# Patient Record
Sex: Female | Born: 1995
Health system: Southern US, Community
[De-identification: ages and names within clinical notes are randomized; demographics above are authoritative.]

## PROBLEM LIST (undated history)

## (undated) DIAGNOSIS — J45909 Unspecified asthma, uncomplicated: Secondary | ICD-10-CM

## (undated) HISTORY — DX: Unspecified asthma, uncomplicated: J45.909

## (undated) HISTORY — PX: WISDOM TOOTH EXTRACTION: SHX21

---
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
## 2018-10-15 DIAGNOSIS — Z124 Encounter for screening for malignant neoplasm of cervix: Secondary | ICD-10-CM | POA: Diagnosis not present

## 2018-10-15 DIAGNOSIS — J45909 Unspecified asthma, uncomplicated: Secondary | ICD-10-CM | POA: Insufficient documentation

## 2018-10-15 DIAGNOSIS — Z6825 Body mass index (BMI) 25.0-25.9, adult: Secondary | ICD-10-CM | POA: Diagnosis not present

## 2018-10-15 DIAGNOSIS — Z01419 Encounter for gynecological examination (general) (routine) without abnormal findings: Secondary | ICD-10-CM | POA: Diagnosis not present

## 2019-01-07 DIAGNOSIS — M5441 Lumbago with sciatica, right side: Secondary | ICD-10-CM | POA: Diagnosis not present

## 2019-07-25 ENCOUNTER — Encounter: Payer: Self-pay | Admitting: Family Medicine

## 2019-07-25 ENCOUNTER — Ambulatory Visit (INDEPENDENT_AMBULATORY_CARE_PROVIDER_SITE_OTHER): Payer: BC Managed Care – PPO | Admitting: Family Medicine

## 2019-07-25 VITALS — BP 130/86 | HR 83 | Temp 98.3°F | Ht 65.0 in | Wt 137.0 lb

## 2019-07-25 DIAGNOSIS — G8929 Other chronic pain: Secondary | ICD-10-CM | POA: Diagnosis not present

## 2019-07-25 DIAGNOSIS — Z23 Encounter for immunization: Secondary | ICD-10-CM | POA: Diagnosis not present

## 2019-07-25 DIAGNOSIS — M545 Low back pain: Secondary | ICD-10-CM | POA: Diagnosis not present

## 2019-07-25 MED ORDER — NAPROXEN 500 MG PO TABS: 500.0000 mg | ORAL_TABLET | Freq: Two times a day (BID) | ORAL | 1 refills | Status: DC

## 2019-07-25 MED ORDER — CYCLOBENZAPRINE HCL 5 MG PO TABS: 5.0000 mg | ORAL_TABLET | Freq: Every evening | ORAL | 0 refills | Status: DC | PRN

## 2019-07-25 NOTE — Progress Notes (Signed)
Robyn [name] is a 23 y.o. female  Chief Complaint  Patient presents with  . Establish Care    est care/ lower back pain/ tightness and sometimes feels down right leg/ went to UC said she had sciatica gave stretches/ 5- 6 mo/ wants flu shot    HPI: Robyn KittenHannah Calvo is a 23 y.o. female here as a new patient to our office. She complains of 6-3454mo h/o low back pain Rt > Lt.  She went to UC in 12/2018, was diagnosed with sciatica, and given home stretching exercises to do at home. She took advil daily x 1-2 wks and now takes as needed.  She has a desk job and sits all day. She describes as a "tightness". No numbness or tingling. No change in bowel or bladder function.  Rarely pain radiates down Rt leg, mostly at night when she lays on Rt side.  No injury or trauma. Pain does not wake her up from sleep.   Pt would like flu vaccine.  Past Medical History:  Diagnosis Date  . Asthma     Past Surgical History:  Procedure Laterality Date  . WISDOM TOOTH EXTRACTION      Social History   Socioeconomic History  . Marital status: Married    Spouse name: Not on file  . Number of children: Not on file  . Years of education: Not on file  . Highest education level: Not on file  Occupational History  . Not on file  Social Needs  . Financial resource strain: Not on file  . Food insecurity    Worry: Not on file    Inability: Not on file  . Transportation needs    Medical: Not on file    Non-medical: Not on file  Tobacco Use  . Smoking status: Never Smoker  . Smokeless tobacco: Never Used  Substance and Sexual Activity  . Alcohol use: Yes    Comment: ccasionally  . Drug use: Never  . Sexual activity: Not on file  Lifestyle  . Physical activity    Days per week: Not on file    Minutes per session: Not on file  . Stress: Not on file  Relationships  . Social Musicianconnections    Talks on phone: Not on file    Gets together: Not on file    Attends religious service: Not on file   Active member of club or organization: Not on file    Attends meetings of clubs or organizations: Not on file    Relationship status: Not on file  . Intimate partner violence    Fear of current or ex partner: Not on file    Emotionally abused: Not on file    Physically abused: Not on file    Forced sexual activity: Not on file  Other Topics Concern  . Not on file  Social History Narrative  . Not on file    Family History  Problem Relation Age of Onset  . Asthma Mother   . Cancer Maternal Grandmother        endometrial  . Breast cancer Paternal Grandmother      There is no immunization history for the selected administration types on file for this patient.  Outpatient Encounter Medications as of 07/25/2019  Medication Sig  . levonorgestrel-ethinyl estradiol (ALESSE) 0.1-20 MG-MCG tablet Sronyx 0.1 mg-20 mcg tablet  TAKE 1 TABLET BY MOUTH EVERY DAY  . cyclobenzaprine (FLEXERIL) 5 MG tablet Take 1 tablet (5 mg total) by mouth at bedtime  as needed for muscle spasms.  . naproxen (NAPROSYN) 500 MG tablet Take 1 tablet (500 mg total) by mouth 2 (two) times daily with a meal.   No facility-administered encounter medications on file as of 07/25/2019.      ROS: Pertinent positives and negatives noted in HPI. Remainder of ROS non-contributory  No Known Allergies  BP 130/86   Pulse 83   Temp 98.3 F (36.8 C) (Oral)   Ht 5\' 5"  (1.651 m)   Wt 137 lb (62.1 kg)   SpO2 100%   BMI 22.80 kg/m   Physical Exam  Constitutional: She is oriented to person, place, and time. She appears well-developed and well-nourished. No distress.  Musculoskeletal: Normal range of motion.        General: No tenderness or edema.  Neurological: She is alert and oriented to person, place, and time. She displays normal reflexes. No cranial nerve deficit. She exhibits normal muscle tone. Coordination normal.  Skin: Skin is warm and dry.  Psychiatric: She has a normal mood and affect. Her behavior is normal.      A/P:  1. Need for influenza vaccination - Flu Vaccine QUAD 6+ mos PF IM (Fluarix Quad PF)  2. Chronic bilateral low back pain without sciatica - likely MSK in etiology - heat, stretching exercises - discussed ergonomic improvements for her workspace that could be beneficial Rx: - naproxen (NAPROSYN) 500 MG tablet; Take 1 tablet (500 mg total) by mouth 2 (two) times daily with a meal.  Dispense: 60 tablet; Refill: 1 - pt to take BID w food x 7-10 days - cyclobenzaprine (FLEXERIL) 5 MG tablet; Take 1 tablet (5 mg total) by mouth at bedtime as needed for muscle spasms.  Dispense: 15 tablet; Refill: 0 - pt to take PRN - if no/minimal improvement in 2-3 wks, could consider xray, PT referral Discussed plan and reviewed medications with patient, including risks, benefits, and potential side effects. Pt expressed understand. All questions answered.

## 2019-07-25 NOTE — Patient Instructions (Signed)
Use heating pad 2x/day for 15-20 min on then off Stretching exercises - see below Take naproxen 500mg  1 tab twice per day with food Take flexeril 5mg  1/2 to 1 tab at bedtime as needed   Low Back Sprain or Strain Rehab Ask your health care provider which exercises are safe for you. Do exercises exactly as told by your health care provider and adjust them as directed. It is normal to feel mild stretching, pulling, tightness, or discomfort as you do these exercises. Stop right away if you feel sudden pain or your pain gets worse. Do not begin these exercises until told by your health care provider. Stretching and range-of-motion exercises These exercises warm up your muscles and joints and improve the movement and flexibility of your back. These exercises also help to relieve pain, numbness, and tingling. Lumbar rotation  1. Lie on your back on a firm surface and bend your knees. 2. Straighten your arms out to your sides so each arm forms a 90-degree angle (right angle) with a side of your body. 3. Slowly move (rotate) both of your knees to one side of your body until you feel a stretch in your lower back (lumbar). Try not to let your shoulders lift off the floor. 4. Hold this position for __________ seconds. 5. Tense your abdominal muscles and slowly move your knees back to the starting position. 6. Repeat this exercise on the other side of your body. Repeat __________ times. Complete this exercise __________ times a day. Single knee to chest  1. Lie on your back on a firm surface with both legs straight. 2. Bend one of your knees. Use your hands to move your knee up toward your chest until you feel a gentle stretch in your lower back and buttock. ? Hold your leg in this position by holding on to the front of your knee. ? Keep your other leg as straight as possible. 3. Hold this position for __________ seconds. 4. Slowly return to the starting position. 5. Repeat with your other leg. Repeat  __________ times. Complete this exercise __________ times a day. Prone extension on elbows  1. Lie on your abdomen on a firm surface (prone position). 2. Prop yourself up on your elbows. 3. Use your arms to help lift your chest up until you feel a gentle stretch in your abdomen and your lower back. ? This will place some of your body weight on your elbows. If this is uncomfortable, try stacking pillows under your chest. ? Your hips should stay down, against the surface that you are lying on. Keep your hip and back muscles relaxed. 4. Hold this position for __________ seconds. 5. Slowly relax your upper body and return to the starting position. Repeat __________ times. Complete this exercise __________ times a day. Strengthening exercises These exercises build strength and endurance in your back. Endurance is the ability to use your muscles for a long time, even after they get tired. Pelvic tilt This exercise strengthens the muscles that lie deep in the abdomen. 1. Lie on your back on a firm surface. Bend your knees and keep your feet flat on the floor. 2. Tense your abdominal muscles. Tip your pelvis up toward the ceiling and flatten your lower back into the floor. ? To help with this exercise, you may place a small towel under your lower back and try to push your back into the towel. 3. Hold this position for __________ seconds. 4. Let your muscles relax completely before you  repeat this exercise. Repeat __________ times. Complete this exercise __________ times a day. Alternating arm and leg raises  1. Get on your hands and knees on a firm surface. If you are on a hard floor, you may want to use padding, such as an exercise mat, to cushion your knees. 2. Line up your arms and legs. Your hands should be directly below your shoulders, and your knees should be directly below your hips. 3. Lift your left leg behind you. At the same time, raise your right arm and straighten it in front of you.  ? Do not lift your leg higher than your hip. ? Do not lift your arm higher than your shoulder. ? Keep your abdominal and back muscles tight. ? Keep your hips facing the ground. ? Do not arch your back. ? Keep your balance carefully, and do not hold your breath. 4. Hold this position for __________ seconds. 5. Slowly return to the starting position. 6. Repeat with your right leg and your left arm. Repeat __________ times. Complete this exercise __________ times a day. Abdominal set with straight leg raise  1. Lie on your back on a firm surface. 2. Bend one of your knees and keep your other leg straight. 3. Tense your abdominal muscles and lift your straight leg up, 4-6 inches (10-15 cm) off the ground. 4. Keep your abdominal muscles tight and hold this position for __________ seconds. ? Do not hold your breath. ? Do not arch your back. Keep it flat against the ground. 5. Keep your abdominal muscles tense as you slowly lower your leg back to the starting position. 6. Repeat with your other leg. Repeat __________ times. Complete this exercise __________ times a day. Single leg lower with bent knees 1. Lie on your back on a firm surface. 2. Tense your abdominal muscles and lift your feet off the floor, one foot at a time, so your knees and hips are bent in 90-degree angles (right angles). ? Your knees should be over your hips and your lower legs should be parallel to the floor. 3. Keeping your abdominal muscles tense and your knee bent, slowly lower one of your legs so your toe touches the ground. 4. Lift your leg back up to return to the starting position. ? Do not hold your breath. ? Do not let your back arch. Keep your back flat against the ground. 5. Repeat with your other leg. Repeat __________ times. Complete this exercise __________ times a day. Posture and body mechanics Good posture and healthy body mechanics can help to relieve stress in your body's tissues and joints. Body  mechanics refers to the movements and positions of your body while you do your daily activities. Posture is part of body mechanics. Good posture means:  Your spine is in its natural S-curve position (neutral).  Your shoulders are pulled back slightly.  Your head is not tipped forward. Follow these guidelines to improve your posture and body mechanics in your everyday activities. Standing   When standing, keep your spine neutral and your feet about hip width apart. Keep a slight bend in your knees. Your ears, shoulders, and hips should line up.  When you do a task in which you stand in one place for a long time, place one foot up on a stable object that is 2-4 inches (5-10 cm) high, such as a footstool. This helps keep your spine neutral. Sitting   When sitting, keep your spine neutral and keep your feet flat  on the floor. Use a footrest, if necessary, and keep your thighs parallel to the floor. Avoid rounding your shoulders, and avoid tilting your head forward.  When working at a desk or a computer, keep your desk at a height where your hands are slightly lower than your elbows. Slide your chair under your desk so you are close enough to maintain good posture.  When working at a computer, place your monitor at a height where you are looking straight ahead and you do not have to tilt your head forward or downward to look at the screen. Resting  When lying down and resting, avoid positions that are most painful for you.  If you have pain with activities such as sitting, bending, stooping, or squatting, lie in a position in which your body does not bend very much. For example, avoid curling up on your side with your arms and knees near your chest (fetal position).  If you have pain with activities such as standing for a long time or reaching with your arms, lie with your spine in a neutral position and bend your knees slightly. Try the following positions: ? Lying on your side with a pillow  between your knees. ? Lying on your back with a pillow under your knees. Lifting   When lifting objects, keep your feet at least shoulder width apart and tighten your abdominal muscles.  Bend your knees and hips and keep your spine neutral. It is important to lift using the strength of your legs, not your back. Do not lock your knees straight out.  Always ask for help to lift heavy or awkward objects. This information is not intended to replace advice given to you by your health care provider. Make sure you discuss any questions you have with your health care provider. Document Released: 11/13/2005 Document Revised: 03/07/2019 Document Reviewed: 12/05/2018 Elsevier Patient Education  2020 [employer]

## 2019-09-30 ENCOUNTER — Other Ambulatory Visit: Payer: Self-pay | Admitting: Family Medicine

## 2019-09-30 DIAGNOSIS — G8929 Other chronic pain: Secondary | ICD-10-CM

## 2019-11-03 DIAGNOSIS — Z20828 Contact with and (suspected) exposure to other viral communicable diseases: Secondary | ICD-10-CM | POA: Diagnosis not present

## 2019-11-06 DIAGNOSIS — Z01419 Encounter for gynecological examination (general) (routine) without abnormal findings: Secondary | ICD-10-CM | POA: Diagnosis not present

## 2019-11-06 DIAGNOSIS — Z6822 Body mass index (BMI) 22.0-22.9, adult: Secondary | ICD-10-CM | POA: Diagnosis not present

## 2019-11-06 DIAGNOSIS — Z124 Encounter for screening for malignant neoplasm of cervix: Secondary | ICD-10-CM | POA: Diagnosis not present

## 2019-11-16 LAB — HM PAP SMEAR

## 2020-01-21 DIAGNOSIS — N926 Irregular menstruation, unspecified: Secondary | ICD-10-CM | POA: Diagnosis not present

## 2020-01-21 DIAGNOSIS — Z6822 Body mass index (BMI) 22.0-22.9, adult: Secondary | ICD-10-CM | POA: Diagnosis not present

## 2020-02-28 ENCOUNTER — Ambulatory Visit: Payer: BC Managed Care – PPO | Attending: Internal Medicine

## 2020-02-28 DIAGNOSIS — Z23 Encounter for immunization: Secondary | ICD-10-CM

## 2020-02-28 NOTE — Progress Notes (Signed)
   Covid-19 Vaccination Clinic  Name:  Khristy Kalan    MRN: [id number] DOB: [birth year]  02/28/2020  Ms. Rorrer was observed post Covid-19 immunization for 15 minutes without incident. She was provided with Vaccine Information Sheet and instruction to access the V-Safe system.   Ms. Berdan was instructed to call 911 with any severe reactions post vaccine: Marland Kitchen Difficulty breathing  . Swelling of face and throat  . A fast heartbeat  . A bad rash all over body  . Dizziness and weakness   Immunizations Administered    Name Date Dose VIS Date Route   Pfizer COVID-19 Vaccine 02/28/2020 12:56 PM 0.3 mL 11/07/2019 Intramuscular   Manufacturer: [employer], [employer]   Lot: [id number]   NDC: [address]-4

## 2020-03-03 DIAGNOSIS — N926 Irregular menstruation, unspecified: Secondary | ICD-10-CM | POA: Diagnosis not present

## 2020-03-03 DIAGNOSIS — Z6822 Body mass index (BMI) 22.0-22.9, adult: Secondary | ICD-10-CM | POA: Diagnosis not present

## 2020-03-23 ENCOUNTER — Ambulatory Visit: Payer: BC Managed Care – PPO | Attending: Internal Medicine

## 2020-03-23 DIAGNOSIS — Z23 Encounter for immunization: Secondary | ICD-10-CM

## 2020-03-23 NOTE — Progress Notes (Signed)
   Covid-19 Vaccination Clinic  Name:  Robyn [name]    MRN: [id number] DOB: 1996-05-08  03/23/2020  [name] was observed post Covid-19 immunization for 15 minutes without incident. She was provided with Vaccine Information Sheet and instruction to access the V-Safe system.   [name] was instructed to call 911 with any severe reactions post vaccine: Marland Kitchen Difficulty breathing  . Swelling of face and throat  . A fast heartbeat  . A bad rash all over body  . Dizziness and weakness   Immunizations Administered    Name Date Dose VIS Date Route   Pfizer COVID-19 Vaccine 03/23/2020 10:31 AM 0.3 mL 01/21/2019 Intramuscular   Manufacturer: [employer], [employer]   Lot: [id number]   NDC: [address]-8

## 2020-04-28 DIAGNOSIS — N970 Female infertility associated with anovulation: Secondary | ICD-10-CM | POA: Diagnosis not present

## 2020-06-01 DIAGNOSIS — Z3201 Encounter for pregnancy test, result positive: Secondary | ICD-10-CM | POA: Diagnosis not present

## 2020-06-01 DIAGNOSIS — N925 Other specified irregular menstruation: Secondary | ICD-10-CM | POA: Diagnosis not present

## 2020-06-01 DIAGNOSIS — Z6822 Body mass index (BMI) 22.0-22.9, adult: Secondary | ICD-10-CM | POA: Diagnosis not present

## 2020-06-01 DIAGNOSIS — Z3490 Encounter for supervision of normal pregnancy, unspecified, unspecified trimester: Secondary | ICD-10-CM | POA: Diagnosis not present

## 2020-06-01 DIAGNOSIS — Z113 Encounter for screening for infections with a predominantly sexual mode of transmission: Secondary | ICD-10-CM | POA: Diagnosis not present

## 2020-06-09 DIAGNOSIS — Z369 Encounter for antenatal screening, unspecified: Secondary | ICD-10-CM | POA: Diagnosis not present

## 2020-06-29 DIAGNOSIS — O3680X9 Pregnancy with inconclusive fetal viability, other fetus: Secondary | ICD-10-CM | POA: Diagnosis not present

## 2020-06-29 DIAGNOSIS — Z3480 Encounter for supervision of other normal pregnancy, unspecified trimester: Secondary | ICD-10-CM | POA: Diagnosis not present

## 2020-07-20 ENCOUNTER — Inpatient Hospital Stay (HOSPITAL_COMMUNITY)
Admission: AD | Admit: 2020-07-20 | Discharge: 2020-07-20 | Disposition: A | Discharge status: Home / Self Care | Payer: BC Managed Care – PPO | Attending: Obstetrics and Gynecology | Admitting: Obstetrics and Gynecology

## 2020-07-20 ENCOUNTER — Other Ambulatory Visit: Payer: Self-pay

## 2020-07-20 ENCOUNTER — Encounter (HOSPITAL_COMMUNITY): Payer: Self-pay | Admitting: Obstetrics and Gynecology

## 2020-07-20 DIAGNOSIS — Z791 Long term (current) use of non-steroidal anti-inflammatories (NSAID): Secondary | ICD-10-CM | POA: Diagnosis not present

## 2020-07-20 DIAGNOSIS — O99512 Diseases of the respiratory system complicating pregnancy, second trimester: Secondary | ICD-10-CM | POA: Diagnosis not present

## 2020-07-20 DIAGNOSIS — G44209 Tension-type headache, unspecified, not intractable: Secondary | ICD-10-CM | POA: Insufficient documentation

## 2020-07-20 DIAGNOSIS — J45909 Unspecified asthma, uncomplicated: Secondary | ICD-10-CM | POA: Insufficient documentation

## 2020-07-20 DIAGNOSIS — Z79899 Other long term (current) drug therapy: Secondary | ICD-10-CM | POA: Diagnosis not present

## 2020-07-20 DIAGNOSIS — O99352 Diseases of the nervous system complicating pregnancy, second trimester: Secondary | ICD-10-CM | POA: Diagnosis not present

## 2020-07-20 DIAGNOSIS — O30002 Twin pregnancy, unspecified number of placenta and unspecified number of amniotic sacs, second trimester: Secondary | ICD-10-CM | POA: Insufficient documentation

## 2020-07-20 DIAGNOSIS — Z3A14 14 weeks gestation of pregnancy: Secondary | ICD-10-CM | POA: Insufficient documentation

## 2020-07-20 LAB — CBC
HCT: 33.2 % — ABNORMAL LOW (ref 36.0–46.0)
Hemoglobin: 11.6 g/dL — ABNORMAL LOW (ref 12.0–15.0)
MCH: 31.3 pg (ref 26.0–34.0)
MCHC: 34.9 g/dL (ref 30.0–36.0)
MCV: 89.5 fL (ref 80.0–100.0)
Platelets: 305 10*3/uL (ref 150–400)
RBC: 3.71 MIL/uL — ABNORMAL LOW (ref 3.87–5.11)
RDW: 13.6 % (ref 11.5–15.5)
WBC: 9.9 10*3/uL (ref 4.0–10.5)
nRBC: 0 % (ref 0.0–0.2)

## 2020-07-20 LAB — URINALYSIS, ROUTINE W REFLEX MICROSCOPIC
Bilirubin Urine: NEGATIVE
Glucose, UA: NEGATIVE mg/dL
Hgb urine dipstick: NEGATIVE
Ketones, ur: 5 mg/dL — AB
Nitrite: NEGATIVE
Protein, ur: NEGATIVE mg/dL
Specific Gravity, Urine: 1.018 (ref 1.005–1.030)
pH: 7 (ref 5.0–8.0)

## 2020-07-20 MED ORDER — ACETAMINOPHEN 500 MG PO TABS: 1000.0000 mg | ORAL_TABLET | Freq: Four times a day (QID) | ORAL | Status: AC | PRN

## 2020-07-20 NOTE — Discharge Instructions (Signed)
General Headache Without Cause A headache is pain or discomfort that is felt around the head or neck area. There are many causes and types of headaches. In some cases, the cause may not be found. Follow these instructions at home: Watch your condition for any changes. Let your doctor know about them. Take these steps to help with your condition: Managing pain      Take over-the-counter and prescription medicines only as told by your doctor.  Lie down in a dark, quiet room when you have a headache.  If told, put ice on your head and neck area: ? Put ice in a plastic bag. ? Place a towel between your skin and the bag. ? Leave the ice on for 20 minutes, 2-3 times per day.  If told, put heat on the affected area. Use the heat source that your doctor recommends, such as a moist heat pack or a heating pad. ? Place a towel between your skin and the heat source. ? Leave the heat on for 20-30 minutes. ? Remove the heat if your skin turns bright red. This is very important if you are unable to feel pain, heat, or cold. You may have a greater risk of getting burned.  Keep lights dim if bright lights bother you or make your headaches worse. Eating and drinking  Eat meals on a regular schedule.  If you drink alcohol: ? Limit how much you use to:  0-1 drink a day for women.  0-2 drinks a day for men. ? Be aware of how much alcohol is in your drink. In the [place] one drink equals one 12 oz bottle of beer (355 mL), one 5 oz glass of wine (148 mL), or one 1 oz glass of hard liquor (44 mL).  Stop drinking caffeine, or reduce how much caffeine you drink. General instructions   Keep a journal to find out if certain things bring on headaches. For example, write down: ? What you eat and drink. ? How much sleep you get. ? Any change to your diet or medicines.  Get a massage or try other ways to relax.  Limit stress.  Sit up straight. Do not tighten (tense) your muscles.  Do not use any  products that contain nicotine or tobacco. This includes cigarettes, e-cigarettes, and chewing tobacco. If you need help quitting, ask your doctor.  Exercise regularly as told by your doctor.  Get enough sleep. This often means 7-9 hours of sleep each night.  Keep all follow-up visits as told by your doctor. This is important. Contact a doctor if:  Your symptoms are not helped by medicine.  You have a headache that feels different than the other headaches.  You feel sick to your stomach (nauseous) or you throw up (vomit).  You have a fever. Get help right away if:  Your headache gets very bad quickly.  Your headache gets worse after a lot of physical activity.  You keep throwing up.  You have a stiff neck.  You have trouble seeing.  You have trouble speaking.  You have pain in the eye or ear.  Your muscles are weak or you lose muscle control.  You lose your balance or have trouble walking.  You feel like you will pass out (faint) or you pass out.  You are mixed up (confused).  You have a seizure. Summary  A headache is pain or discomfort that is felt around the head or neck area.  There are many causes and   types of headaches. In some cases, the cause may not be found.  Keep a journal to help find out what causes your headaches. Watch your condition for any changes. Let your doctor know about them.  Contact a doctor if you have a headache that is different from usual, or if your headache is not helped by medicine.  Get help right away if your headache gets very bad, you throw up, you have trouble seeing, you lose your balance, or you have a seizure. This information is not intended to replace advice given to you by your health care provider. Make sure you discuss any questions you have with your health care provider. Document Revised: 06/03/2018 Document Reviewed: 06/03/2018 Elsevier Patient Education  2020 [employer].                   Safe Medications in  Pregnancy    Acne: Benzoyl Peroxide Salicylic Acid  Backache/Headache: Tylenol: 2 regular strength every 4 hours OR              2 Extra strength every 6 hours  Colds/Coughs/Allergies: Benadryl (alcohol free) 25 mg every 6 hours as needed Breath right strips Claritin Cepacol throat lozenges Chloraseptic throat spray Cold-Eeze- up to three times per day Cough drops, alcohol free Flonase (by prescription only) Guaifenesin Mucinex Robitussin DM (plain only, alcohol free) Saline nasal spray/drops Sudafed (pseudoephedrine) & Actifed ** use only after [redacted] weeks gestation and if you do not have high blood pressure Tylenol Vicks Vaporub Zinc lozenges Zyrtec   Constipation: Colace Ducolax suppositories Fleet enema Glycerin suppositories Metamucil Milk of magnesia Miralax Senokot Smooth move tea  Diarrhea: Kaopectate Imodium A-D  *NO pepto Bismol  Hemorrhoids: Anusol Anusol HC Preparation H Tucks  Indigestion: Tums Maalox Mylanta Zantac  Pepcid  Insomnia: Benadryl (alcohol free) 25mg  every 6 hours as needed Tylenol PM Unisom, no Gelcaps  Leg Cramps: Tums MagGel  Nausea/Vomiting:  Bonine Dramamine Emetrol Ginger extract Sea bands Meclizine  Nausea medication to take during pregnancy:  Unisom (doxylamine succinate 25 mg tablets) Take one tablet daily at bedtime. If symptoms are not adequately controlled, the dose can be increased to a maximum recommended dose of two tablets daily (1/2 tablet in the morning, 1/2 tablet mid-afternoon and one at bedtime). Vitamin B6 100mg  tablets. Take one tablet twice a day (up to 200 mg per day).  Skin Rashes: Aveeno products Benadryl cream or 25mg  every 6 hours as needed Calamine Lotion 1% cortisone cream  Yeast infection: Gyne-lotrimin 7 Monistat 7   **If taking multiple medications, please check labels to avoid duplicating the same active ingredients **take medication as directed on the label ** Do not  exceed 4000 mg of tylenol in 24 hours **Do not take medications that contain aspirin or ibuprofen

## 2020-07-20 NOTE — MAU Provider Note (Signed)
History     CSN: [id number]  Arrival date and time: 07/20/20 1749   First Provider Initiated Contact with Patient 07/20/20 1852      Chief Complaint  Patient presents with  . Headache   24 y.o. G1 @14 .3 wks with twins presenting with HA and lightheadedness. Sx started 4-5 days ago. Happens mostly in am and gets better during the day. HA is generalized. Rates pain 3/10. She took 1 Tylenol but it didn't help. No syncope. No visual disturbances but did see floaters once. She is eating and drinking well. No fevers. No pregnancy complaints.   OB History    Gravida  1   Para      Term      Preterm      AB      Living        SAB      TAB      Ectopic      Multiple      Live Births              Past Medical History:  Diagnosis Date  . Asthma     Past Surgical History:  Procedure Laterality Date  . WISDOM TOOTH EXTRACTION      Family History  Problem Relation Age of Onset  . Asthma Mother   . Cancer Maternal Grandmother        endometrial  . Breast cancer Paternal Grandmother     Social History   Tobacco Use  . Smoking status: Never Smoker  . Smokeless tobacco: Never Used  Substance Use Topics  . Alcohol use: Not Currently    Comment: ccasionally  . Drug use: Never    Allergies: No Known Allergies  Medications Prior to Admission  Medication Sig Dispense Refill Last Dose  . [DISCONTINUED] acetaminophen (TYLENOL) 325 MG tablet Take 325 mg by mouth every 6 (six) hours as needed.   07/20/2020 at Unknown time  . cyclobenzaprine (FLEXERIL) 5 MG tablet Take 1 tablet (5 mg total) by mouth at bedtime as needed for muscle spasms. 15 tablet 0   . levonorgestrel-ethinyl estradiol (ALESSE) 0.1-20 MG-MCG tablet Sronyx 0.1 mg-20 mcg tablet  TAKE 1 TABLET BY MOUTH EVERY DAY     . naproxen (NAPROSYN) 500 MG tablet TAKE 1 TABLET BY MOUTH TWICE DAILY WITH A MEAL 60 tablet 1     Review of Systems  Constitutional: Negative for chills and fever.  Eyes: Positive  for visual disturbance.  Genitourinary: Negative for dysuria, frequency, hematuria and urgency.  Neurological: Positive for light-headedness and headaches. Negative for syncope.   Physical Exam   Blood pressure 124/80, pulse (!) 107, temperature 98.2 F (36.8 C), resp. rate 16, height 5\' 5"  (1.651 m), weight 62.4 kg, SpO2 100 %.  Physical Exam Vitals and nursing note reviewed.  Constitutional:      General: She is not in acute distress.    Appearance: Normal appearance.  HENT:     Head: Normocephalic and atraumatic.  Pulmonary:     Effort: Pulmonary effort is normal. No respiratory distress.  Musculoskeletal:     Cervical back: Normal range of motion.  Skin:    General: Skin is warm and dry.  Neurological:     General: No focal deficit present.     Mental Status: She is alert and oriented to person, place, and time.  Psychiatric:        Mood and Affect: Mood normal.   FHT 155/148  Results for orders placed or  performed during the hospital encounter of 07/20/20 (from the past 24 hour(s))  CBC     Status: Abnormal   Collection Time: 07/20/20  6:51 PM  Result Value Ref Range   WBC 9.9 4.0 - 10.5 K/uL   RBC 3.71 (L) 3.87 - 5.11 MIL/uL   Hemoglobin 11.6 (L) 12.0 - 15.0 g/dL   HCT 04.5 (L) 36 - 46 %   MCV 89.5 80.0 - 100.0 fL   MCH 31.3 26.0 - 34.0 pg   MCHC 34.9 30.0 - 36.0 g/dL   RDW 40.9 81.1 - 91.4 %   Platelets 305 150 - 400 K/uL   nRBC 0.0 0.0 - 0.2 %  Urinalysis, Routine w reflex microscopic Urine, Clean Catch     Status: Abnormal   Collection Time: 07/20/20  6:55 PM  Result Value Ref Range   Color, Urine YELLOW YELLOW   APPearance HAZY (A) CLEAR   Specific Gravity, Urine 1.018 1.005 - 1.030   pH 7.0 5.0 - 8.0   Glucose, UA NEGATIVE NEGATIVE mg/dL   Hgb urine dipstick NEGATIVE NEGATIVE   Bilirubin Urine NEGATIVE NEGATIVE   Ketones, ur 5 (A) NEGATIVE mg/dL   Protein, ur NEGATIVE NEGATIVE mg/dL   Nitrite NEGATIVE NEGATIVE   Leukocytes,Ua SMALL (A) NEGATIVE    RBC / HPF 0-5 0 - 5 RBC/hpf   WBC, UA 0-5 0 - 5 WBC/hpf   Bacteria, UA MANY (A) NONE SEEN   Squamous Epithelial / LPF 0-5 0 - 5   Mucus PRESENT    Ca Oxalate Crys, UA PRESENT    MAU Course  Procedures  MDM Labs ordered and reviewed. Declines meds. Not orthostatic. No signs of dehydration. No etiology for HA identified. Discussed comfort measures. UA with many bacteria, pt w/o sx, UC ordered. Stable for discharge home.   Assessment and Plan   1. [redacted] weeks gestation of pregnancy   2. Acute non intractable tension-type headache    Discharge home Follow up at [facility] as scheduled Return precautions  Allergies as of 07/20/2020   No Known Allergies     Medication List    STOP taking these medications   cyclobenzaprine 5 MG tablet Commonly known as: FLEXERIL   levonorgestrel-ethinyl estradiol 0.1-20 MG-MCG tablet Commonly known as: ALESSE   naproxen 500 MG tablet Commonly known as: NAPROSYN     TAKE these medications   acetaminophen 500 MG tablet Commonly known as: TYLENOL Take 2 tablets (1,000 mg total) by mouth every 6 (six) hours as needed for headache. What changed:   medication strength  how much to take  reasons to take this       Donette Larry, CNM 07/20/2020, 7:48 PM

## 2020-07-20 NOTE — MAU Note (Signed)
Robyn [name] is a 24 y.o. at [redacted]w[redacted]d here in MAU reporting: the past couple of mornings she has woken up with a headache and dizziness. Usually goes away with food and water. States she still currently has a headache but is not dizzy. States this is a twin pregnancy.  Onset of complaint: ongoing  Pain score: 4/10  Vitals:   07/20/20 1802  BP: 112/75  Pulse: 77  Resp: 16  Temp: 98.2 F (36.8 C)  SpO2: 100%     Lab orders placed from triage: UA

## 2020-07-22 DIAGNOSIS — Z369 Encounter for antenatal screening, unspecified: Secondary | ICD-10-CM | POA: Diagnosis not present

## 2020-07-22 LAB — CULTURE, OB URINE: Culture: <10000 — AB

## 2020-08-12 DIAGNOSIS — Z1152 Encounter for screening for COVID-19: Secondary | ICD-10-CM | POA: Diagnosis not present

## 2020-08-19 DIAGNOSIS — O30042 Twin pregnancy, dichorionic/diamniotic, second trimester: Secondary | ICD-10-CM | POA: Diagnosis not present

## 2020-08-19 DIAGNOSIS — Z23 Encounter for immunization: Secondary | ICD-10-CM | POA: Diagnosis not present

## 2020-08-19 DIAGNOSIS — Z363 Encounter for antenatal screening for malformations: Secondary | ICD-10-CM | POA: Diagnosis not present

## 2020-09-16 DIAGNOSIS — Z369 Encounter for antenatal screening, unspecified: Secondary | ICD-10-CM | POA: Diagnosis not present

## 2020-09-16 DIAGNOSIS — O30042 Twin pregnancy, dichorionic/diamniotic, second trimester: Secondary | ICD-10-CM | POA: Diagnosis not present

## 2020-09-27 ENCOUNTER — Other Ambulatory Visit: Payer: Self-pay

## 2020-09-27 ENCOUNTER — Inpatient Hospital Stay (HOSPITAL_COMMUNITY)
Admission: AD | Admit: 2020-09-27 | Discharge: 2020-09-27 | Disposition: A | Discharge status: Home / Self Care | Payer: BC Managed Care – PPO | Attending: Obstetrics and Gynecology | Admitting: Obstetrics and Gynecology

## 2020-09-27 ENCOUNTER — Encounter (HOSPITAL_COMMUNITY): Payer: Self-pay | Admitting: Obstetrics and Gynecology

## 2020-09-27 DIAGNOSIS — O99512 Diseases of the respiratory system complicating pregnancy, second trimester: Secondary | ICD-10-CM | POA: Diagnosis not present

## 2020-09-27 DIAGNOSIS — J45909 Unspecified asthma, uncomplicated: Secondary | ICD-10-CM | POA: Diagnosis not present

## 2020-09-27 DIAGNOSIS — O26892 Other specified pregnancy related conditions, second trimester: Secondary | ICD-10-CM | POA: Diagnosis not present

## 2020-09-27 DIAGNOSIS — Z3A24 24 weeks gestation of pregnancy: Secondary | ICD-10-CM | POA: Diagnosis not present

## 2020-09-27 DIAGNOSIS — R102 Pelvic and perineal pain: Secondary | ICD-10-CM | POA: Insufficient documentation

## 2020-09-27 DIAGNOSIS — O30042 Twin pregnancy, dichorionic/diamniotic, second trimester: Secondary | ICD-10-CM | POA: Diagnosis not present

## 2020-09-27 LAB — URINALYSIS, ROUTINE W REFLEX MICROSCOPIC
Bilirubin Urine: NEGATIVE
Glucose, UA: NEGATIVE mg/dL
Hgb urine dipstick: NEGATIVE
Ketones, ur: NEGATIVE mg/dL
Nitrite: NEGATIVE
Protein, ur: NEGATIVE mg/dL
Specific Gravity, Urine: 1.006 (ref 1.005–1.030)
pH: 8 (ref 5.0–8.0)

## 2020-09-27 MED ORDER — COMFORT FIT MATERNITY SUPP SM MISC: 1.0000 [IU] | Freq: Every day | 0 refills | Status: DC | PRN

## 2020-09-27 NOTE — Discharge Instructions (Signed)
Recommend pregnancy support belt/maternity support belt.  One brand is called Prenatal Cradle You may find these on [employer].com, [employer].com, or [employer].com     Preterm Labor and Birth Information  The normal length of a pregnancy is 39-41 weeks. Preterm labor is when labor starts before 37 completed weeks of pregnancy. What are the risk factors for preterm labor? Preterm labor is more likely to occur in women who:  Have certain infections during pregnancy such as a bladder infection, sexually transmitted infection, or infection inside the uterus (chorioamnionitis).  Have a shorter-than-normal cervix.  Have gone into preterm labor before.  Have had surgery on their cervix.  Are younger than age 37 or older than age 15.  Are African American.  Are pregnant with twins or multiple babies (multiple gestation).  Take street drugs or smoke while pregnant.  Do not gain enough weight while pregnant.  Became pregnant shortly after having been pregnant. What are the symptoms of preterm labor? Symptoms of preterm labor include:  Cramps similar to those that can happen during a menstrual period. The cramps may happen with diarrhea.  Pain in the abdomen or lower back.  Regular uterine contractions that may feel like tightening of the abdomen.  A feeling of increased pressure in the pelvis.  Increased watery or bloody mucus discharge from the vagina.  Water breaking (ruptured amniotic sac). Why is it important to recognize signs of preterm labor? It is important to recognize signs of preterm labor because babies who are born prematurely may not be fully developed. This can put them at an increased risk for:  Long-term (chronic) heart and lung problems.  Difficulty immediately after birth with regulating body systems, including blood sugar, body temperature, heart rate, and breathing rate.  Bleeding in the brain.  Cerebral palsy.  Learning difficulties.  Death. These risks  are highest for babies who are born before 34 weeks of pregnancy. How is preterm labor treated? Treatment depends on the length of your pregnancy, your condition, and the health of your baby. It may involve:  Having a stitch (suture) placed in your cervix to prevent your cervix from opening too early (cerclage).  Taking or being given medicines, such as: ? Hormone medicines. These may be given early in pregnancy to help support the pregnancy. ? Medicine to stop contractions. ? Medicines to help mature the baby's lungs. These may be prescribed if the risk of delivery is high. ? Medicines to prevent your baby from developing cerebral palsy. If the labor happens before 34 weeks of pregnancy, you may need to stay in the hospital. What should I do if I think I am in preterm labor? If you think that you are going into preterm labor, call your health care provider right away. How can I prevent preterm labor in future pregnancies? To increase your chance of having a full-term pregnancy:  Do not use any tobacco products, such as cigarettes, chewing tobacco, and e-cigarettes. If you need help quitting, ask your health care provider.  Do not use street drugs or medicines that have not been prescribed to you during your pregnancy.  Talk with your health care provider before taking any herbal supplements, even if you have been taking them regularly.  Make sure you gain a healthy amount of weight during your pregnancy.  Watch for infection. If you think that you might have an infection, get it checked right away.  Make sure to tell your health care provider if you have gone into preterm labor before.  This information is not intended to replace advice given to you by your health care provider. Make sure you discuss any questions you have with your health care provider. Document Revised: 03/07/2019 Document Reviewed: 04/05/2016 Elsevier Patient Education  2020 [employer].

## 2020-09-27 NOTE — MAU Note (Signed)
PTL s/s reviewed: VB, more than 5 contractions in one hour, LOF, decreased FM. Patient verbalizes understanding.

## 2020-09-27 NOTE — MAU Provider Note (Signed)
History     CSN: [id number]  Arrival date and time: 09/27/20 1059   First Provider Initiated Contact with Patient 09/27/20 1256      Chief Complaint  Patient presents with  . Abdominal Pain   HPI  Robyn [name] is a 24 y.o. G1P0 at [redacted]w[redacted]d with di/di twins who presents with abdominal pain. Symptoms started yesterday. Reports constant pain & pressure in her lower abdomen & pelvis. Discomfort is worse with walking. Denies dysuria, vaginal bleeding, or LOF. Good fetal movement x2.   Location: pelvis Quality: pressure Severity: 5/10 on pain scale Duration: 1 day Timing: constant Modifying factors: worse with walking Associated signs and symptoms: none   OB History    Gravida  1   Para      Term      Preterm      AB      Living        SAB      TAB      Ectopic      Multiple      Live Births              Past Medical History:  Diagnosis Date  . Asthma     Past Surgical History:  Procedure Laterality Date  . WISDOM TOOTH EXTRACTION      Family History  Problem Relation Age of Onset  . Asthma Mother   . Cancer Maternal Grandmother        endometrial  . Breast cancer Paternal Grandmother     Social History   Tobacco Use  . Smoking status: Never Smoker  . Smokeless tobacco: Never Used  Substance Use Topics  . Alcohol use: Not Currently    Comment: ccasionally  . Drug use: Never    Allergies: No Known Allergies  No medications prior to admission.    Review of Systems  Constitutional: Negative.   Gastrointestinal: Positive for abdominal pain.  Genitourinary: Negative.    Physical Exam   Blood pressure 112/69, pulse 72, temperature (!) 97.4 F (36.3 C), temperature source Oral, resp. rate 17, height 5\' 5"  (1.651 m), weight 70.6 kg, SpO2 100 %.  Physical Exam Vitals and nursing note reviewed. Exam conducted with a chaperone present.  Constitutional:      General: She is not in acute distress.    Appearance: She is well-developed.   Pulmonary:     Effort: Pulmonary effort is normal. No respiratory distress.  Abdominal:     Palpations: Abdomen is soft.     Tenderness: There is no abdominal tenderness.     Comments: Gravid uterus  Genitourinary:    Comments: Cervix closed/thick/-3/posterior Neurological:     Mental Status: She is alert.    Fetal Tracing: [name] Baseline: 140 Variability: moderate Accelerations: 10x10 Decelerations: none  Baby B Baseline: 135 Variability: moderate Accelerations: 15x15 Decelerations: variable  Toco: none  MAU Course  Procedures Results for orders placed or performed during the hospital encounter of 09/27/20 (from the past 24 hour(s))  Urinalysis, Routine w reflex microscopic Urine, Clean Catch     Status: Abnormal   Collection Time: 09/27/20 11:49 AM  Result Value Ref Range   Color, Urine YELLOW YELLOW   APPearance CLEAR CLEAR   Specific Gravity, Urine 1.006 1.005 - 1.030   pH 8.0 5.0 - 8.0   Glucose, UA NEGATIVE NEGATIVE mg/dL   Hgb urine dipstick NEGATIVE NEGATIVE   Bilirubin Urine NEGATIVE NEGATIVE   Ketones, ur NEGATIVE NEGATIVE mg/dL   Protein, ur  NEGATIVE NEGATIVE mg/dL   Nitrite NEGATIVE NEGATIVE   Leukocytes,Ua TRACE (A) NEGATIVE   RBC / HPF 0-5 0 - 5 RBC/hpf   WBC, UA 0-5 0 - 5 WBC/hpf   Bacteria, UA FEW (A) NONE SEEN   Squamous Epithelial / LPF 0-5 0 - 5   Mucus PRESENT     MDM Fetal tracing appropriate for gestational age x 2. No contractions. Cervix closed/thick.  U/a with trace leuks; no urinary complaints. Urine culture sent.   Cervix closed/thick/posterior.   Assessment and Plan   1. Pelvic pressure in pregnancy, antepartum, second trimester   2. Dichorionic diamniotic twin pregnancy in second trimester   3. Pain of round ligament affecting pregnancy, antepartum   4. [redacted] weeks gestation of pregnancy    -Reviewed PTL precautions & reasons to return to MAU -Rx maternity support belt & given info for bio tech for fitting -urine culture  pending  Judeth Horn 09/27/2020, 6:50 PM

## 2020-09-27 NOTE — MAU Note (Signed)
Having a lot more pressure and pain in lower abd.  Getting a lot harder to walk.  Is 24 wks preg with twins. Has maybe been a little constipated

## 2020-09-28 LAB — CULTURE, OB URINE: Culture: NO GROWTH

## 2020-10-15 DIAGNOSIS — Z23 Encounter for immunization: Secondary | ICD-10-CM | POA: Diagnosis not present

## 2020-10-15 DIAGNOSIS — Z3480 Encounter for supervision of other normal pregnancy, unspecified trimester: Secondary | ICD-10-CM | POA: Diagnosis not present

## 2020-10-15 DIAGNOSIS — O30002 Twin pregnancy, unspecified number of placenta and unspecified number of amniotic sacs, second trimester: Secondary | ICD-10-CM | POA: Diagnosis not present

## 2020-11-05 DIAGNOSIS — Z369 Encounter for antenatal screening, unspecified: Secondary | ICD-10-CM | POA: Diagnosis not present

## 2020-11-17 DIAGNOSIS — Z369 Encounter for antenatal screening, unspecified: Secondary | ICD-10-CM | POA: Diagnosis not present

## 2020-11-22 ENCOUNTER — Other Ambulatory Visit: Payer: Self-pay | Admitting: Obstetrics and Gynecology

## 2020-11-22 DIAGNOSIS — O30043 Twin pregnancy, dichorionic/diamniotic, third trimester: Secondary | ICD-10-CM

## 2020-11-24 ENCOUNTER — Other Ambulatory Visit: Payer: Self-pay

## 2020-11-24 ENCOUNTER — Other Ambulatory Visit: Payer: Self-pay | Admitting: *Deleted

## 2020-11-24 ENCOUNTER — Ambulatory Visit: Payer: BC Managed Care – PPO | Attending: Obstetrics and Gynecology

## 2020-11-24 ENCOUNTER — Encounter: Payer: Self-pay | Admitting: *Deleted

## 2020-11-24 ENCOUNTER — Ambulatory Visit: Payer: BC Managed Care – PPO | Admitting: *Deleted

## 2020-11-24 VITALS — BP 113/75 | HR 87

## 2020-11-24 DIAGNOSIS — O283 Abnormal ultrasonic finding on antenatal screening of mother: Secondary | ICD-10-CM

## 2020-11-24 DIAGNOSIS — Z363 Encounter for antenatal screening for malformations: Secondary | ICD-10-CM | POA: Diagnosis not present

## 2020-11-24 DIAGNOSIS — O30043 Twin pregnancy, dichorionic/diamniotic, third trimester: Secondary | ICD-10-CM

## 2020-11-24 DIAGNOSIS — Z3A32 32 weeks gestation of pregnancy: Secondary | ICD-10-CM | POA: Diagnosis not present

## 2020-12-03 DIAGNOSIS — O30043 Twin pregnancy, dichorionic/diamniotic, third trimester: Secondary | ICD-10-CM | POA: Diagnosis not present

## 2020-12-09 ENCOUNTER — Inpatient Hospital Stay (HOSPITAL_COMMUNITY): Payer: BC Managed Care – PPO | Admitting: Anesthesiology

## 2020-12-09 ENCOUNTER — Encounter (HOSPITAL_COMMUNITY): Payer: Self-pay | Admitting: Obstetrics

## 2020-12-09 ENCOUNTER — Inpatient Hospital Stay (HOSPITAL_COMMUNITY)
Admission: AD | Admit: 2020-12-09 | Discharge: 2020-12-13 | DRG: 786 | Disposition: A | Discharge status: Home / Self Care | Payer: BC Managed Care – PPO | Attending: Obstetrics and Gynecology | Admitting: Obstetrics and Gynecology

## 2020-12-09 ENCOUNTER — Other Ambulatory Visit: Payer: Self-pay

## 2020-12-09 ENCOUNTER — Encounter (HOSPITAL_COMMUNITY)
Admission: AD | Disposition: A | Discharge status: Home / Self Care | Payer: Self-pay | Source: Home / Self Care | Attending: Obstetrics and Gynecology

## 2020-12-09 DIAGNOSIS — O358XX1 Maternal care for other (suspected) fetal abnormality and damage, fetus 1: Secondary | ICD-10-CM | POA: Diagnosis present

## 2020-12-09 DIAGNOSIS — O328XX2 Maternal care for other malpresentation of fetus, fetus 2: Secondary | ICD-10-CM | POA: Diagnosis present

## 2020-12-09 DIAGNOSIS — Z3A34 34 weeks gestation of pregnancy: Secondary | ICD-10-CM | POA: Diagnosis not present

## 2020-12-09 DIAGNOSIS — O43893 Other placental disorders, third trimester: Secondary | ICD-10-CM | POA: Diagnosis not present

## 2020-12-09 DIAGNOSIS — O321XX1 Maternal care for breech presentation, fetus 1: Principal | ICD-10-CM | POA: Diagnosis present

## 2020-12-09 DIAGNOSIS — O411230 Chorioamnionitis, third trimester, not applicable or unspecified: Secondary | ICD-10-CM | POA: Diagnosis not present

## 2020-12-09 DIAGNOSIS — O320XX1 Maternal care for unstable lie, fetus 1: Secondary | ICD-10-CM | POA: Diagnosis not present

## 2020-12-09 DIAGNOSIS — Z369 Encounter for antenatal screening, unspecified: Secondary | ICD-10-CM | POA: Diagnosis not present

## 2020-12-09 DIAGNOSIS — O30043 Twin pregnancy, dichorionic/diamniotic, third trimester: Secondary | ICD-10-CM | POA: Diagnosis not present

## 2020-12-09 DIAGNOSIS — Z20822 Contact with and (suspected) exposure to covid-19: Secondary | ICD-10-CM | POA: Diagnosis not present

## 2020-12-09 DIAGNOSIS — O2686 Pruritic urticarial papules and plaques of pregnancy (PUPPP): Secondary | ICD-10-CM | POA: Diagnosis not present

## 2020-12-09 DIAGNOSIS — O43193 Other malformation of placenta, third trimester: Secondary | ICD-10-CM | POA: Diagnosis not present

## 2020-12-09 DIAGNOSIS — O30003 Twin pregnancy, unspecified number of placenta and unspecified number of amniotic sacs, third trimester: Secondary | ICD-10-CM | POA: Diagnosis present

## 2020-12-09 DIAGNOSIS — Z98891 History of uterine scar from previous surgery: Secondary | ICD-10-CM

## 2020-12-09 DIAGNOSIS — Z3480 Encounter for supervision of other normal pregnancy, unspecified trimester: Secondary | ICD-10-CM | POA: Diagnosis not present

## 2020-12-09 LAB — RESP PANEL BY RT-PCR (FLU A&B, COVID) ARPGX2
Influenza A by PCR: NEGATIVE
Influenza B by PCR: NEGATIVE
SARS Coronavirus 2 by RT PCR: NEGATIVE

## 2020-12-09 LAB — CBC
HCT: 34.1 % — ABNORMAL LOW (ref 36.0–46.0)
Hemoglobin: 12.1 g/dL (ref 12.0–15.0)
MCH: 32 pg (ref 26.0–34.0)
MCHC: 35.5 g/dL (ref 30.0–36.0)
MCV: 90.2 fL (ref 80.0–100.0)
Platelets: 244 10*3/uL (ref 150–400)
RBC: 3.78 MIL/uL — ABNORMAL LOW (ref 3.87–5.11)
RDW: 13.7 % (ref 11.5–15.5)
WBC: 14.3 10*3/uL — ABNORMAL HIGH (ref 4.0–10.5)
nRBC: 0.2 % (ref 0.0–0.2)

## 2020-12-09 LAB — COMPREHENSIVE METABOLIC PANEL
ALT: 14 U/L (ref 0–44)
AST: 23 U/L (ref 15–41)
Albumin: 2.6 g/dL — ABNORMAL LOW (ref 3.5–5.0)
Alkaline Phosphatase: 168 U/L — ABNORMAL HIGH (ref 38–126)
Anion gap: 12 (ref 5–15)
BUN: 5 mg/dL — ABNORMAL LOW (ref 6–20)
CO2: 19 mmol/L — ABNORMAL LOW (ref 22–32)
Calcium: 8.8 mg/dL — ABNORMAL LOW (ref 8.9–10.3)
Chloride: 99 mmol/L (ref 98–111)
Creatinine, Ser: 0.61 mg/dL (ref 0.44–1.00)
GFR, Estimated: >60 mL/min (ref >60)
Glucose, Bld: 83 mg/dL (ref 70–99)
Potassium: 4 mmol/L (ref 3.5–5.1)
Sodium: 130 mmol/L — ABNORMAL LOW (ref 135–145)
Total Bilirubin: 0.6 mg/dL (ref 0.3–1.2)
Total Protein: 5.8 g/dL — ABNORMAL LOW (ref 6.5–8.1)

## 2020-12-09 LAB — URINALYSIS, ROUTINE W REFLEX MICROSCOPIC
Bacteria, UA: NONE SEEN
Bilirubin Urine: NEGATIVE
Glucose, UA: NEGATIVE mg/dL
Ketones, ur: NEGATIVE mg/dL
Nitrite: NEGATIVE
Protein, ur: NEGATIVE mg/dL
Specific Gravity, Urine: 1.015 (ref 1.005–1.030)
pH: 6 (ref 5.0–8.0)

## 2020-12-09 LAB — TYPE AND SCREEN
ABO/RH(D): A POS
Antibody Screen: NEGATIVE

## 2020-12-09 SURGERY — Surgical Case
Anesthesia: Spinal

## 2020-12-09 MED ORDER — FENTANYL CITRATE (PF) 100 MCG/2ML IJ SOLN
INTRAMUSCULAR | Status: AC
  Filled 2020-12-09: qty 2

## 2020-12-09 MED ORDER — SCOPOLAMINE 1 MG/3DAYS TD PT72
1.0000 | MEDICATED_PATCH | Freq: Once | TRANSDERMAL | Status: DC
  Administered 2020-12-09: 1.5 mg via TRANSDERMAL
  Filled 2020-12-09: qty 1

## 2020-12-09 MED ORDER — MORPHINE SULFATE (PF) 0.5 MG/ML IJ SOLN
INTRAMUSCULAR | Status: DC | PRN
  Administered 2020-12-09: 150 ug via EPIDURAL

## 2020-12-09 MED ORDER — MORPHINE SULFATE (PF) 0.5 MG/ML IJ SOLN
INTRAMUSCULAR | Status: AC
  Filled 2020-12-09: qty 10

## 2020-12-09 MED ORDER — FENTANYL CITRATE (PF) 100 MCG/2ML IJ SOLN
INTRAMUSCULAR | Status: DC | PRN
  Administered 2020-12-09: 85 ug via INTRAVENOUS
  Administered 2020-12-09: 15 ug via INTRAVENOUS

## 2020-12-09 MED ORDER — PHENYLEPHRINE HCL-NACL 20-0.9 MG/250ML-% IV SOLN
INTRAVENOUS | Status: DC | PRN
  Administered 2020-12-09: 60 ug/min via INTRAVENOUS

## 2020-12-09 MED ORDER — BUPIVACAINE IN DEXTROSE 0.75-8.25 % IT SOLN
INTRATHECAL | Status: DC | PRN
  Administered 2020-12-09: 1.6 mg via INTRATHECAL

## 2020-12-09 MED ORDER — METHYLERGONOVINE MALEATE 0.2 MG/ML IJ SOLN
INTRAMUSCULAR | Status: DC | PRN
  Administered 2020-12-09: .2 mg via INTRAMUSCULAR

## 2020-12-09 MED ORDER — LACTATED RINGERS IV SOLN: INTRAVENOUS | Status: DC

## 2020-12-09 MED ORDER — CEFAZOLIN SODIUM-DEXTROSE 2-4 GM/100ML-% IV SOLN: 2.0000 g | Freq: Once | INTRAVENOUS | Status: DC

## 2020-12-09 MED ORDER — SOD CITRATE-CITRIC ACID 500-334 MG/5ML PO SOLN
30.0000 mL | ORAL | Status: AC
  Administered 2020-12-09: 30 mL via ORAL
  Filled 2020-12-09: qty 15

## 2020-12-09 MED ORDER — CEFAZOLIN SODIUM-DEXTROSE 2-4 GM/100ML-% IV SOLN
2.0000 g | INTRAVENOUS | Status: AC
  Administered 2020-12-09: 2 g via INTRAVENOUS
  Filled 2020-12-09: qty 100

## 2020-12-09 SURGICAL SUPPLY — 34 items
BENZOIN TINCTURE PRP APPL 2/3 (GAUZE/BANDAGES/DRESSINGS) ×2 IMPLANT
CHLORAPREP W/TINT 26ML (MISCELLANEOUS) ×2 IMPLANT
CLAMP CORD UMBIL (MISCELLANEOUS) IMPLANT
CLOSURE STERI STRIP 1/2 X4 (GAUZE/BANDAGES/DRESSINGS) ×2 IMPLANT
CLOTH BEACON ORANGE TIMEOUT ST (SAFETY) ×2 IMPLANT
DRSG OPSITE POSTOP 4X10 (GAUZE/BANDAGES/DRESSINGS) ×2 IMPLANT
ELECT REM PT RETURN 9FT ADLT (ELECTROSURGICAL) ×2
ELECTRODE REM PT RTRN 9FT ADLT (ELECTROSURGICAL) ×1 IMPLANT
EXTRACTOR VACUUM M CUP 4 TUBE (SUCTIONS) IMPLANT
GLOVE BIOGEL PI IND STRL 6.5 (GLOVE) ×1 IMPLANT
GLOVE BIOGEL PI IND STRL 7.0 (GLOVE) ×1 IMPLANT
GLOVE BIOGEL PI INDICATOR 6.5 (GLOVE) ×1
GLOVE BIOGEL PI INDICATOR 7.0 (GLOVE) ×1
GLOVE ECLIPSE 6.5 STRL STRAW (GLOVE) ×2 IMPLANT
GOWN STRL REUS W/TWL LRG LVL3 (GOWN DISPOSABLE) ×4 IMPLANT
KIT ABG SYR 3ML LUER SLIP (SYRINGE) IMPLANT
NEEDLE HYPO 25X5/8 SAFETYGLIDE (NEEDLE) IMPLANT
NS IRRIG 1000ML POUR BTL (IV SOLUTION) ×2 IMPLANT
PACK C SECTION WH (CUSTOM PROCEDURE TRAY) ×2 IMPLANT
PAD ABD 7.5X8 STRL (GAUZE/BANDAGES/DRESSINGS) IMPLANT
PAD OB MATERNITY 4.3X12.25 (PERSONAL CARE ITEMS) ×2 IMPLANT
PENCIL SMOKE EVAC W/HOLSTER (ELECTROSURGICAL) ×2 IMPLANT
RTRCTR C-SECT PINK 25CM LRG (MISCELLANEOUS) ×2 IMPLANT
SUT MON AB 2-0 CT1 27 (SUTURE) ×2 IMPLANT
SUT PDS AB 0 CTX 60 (SUTURE) IMPLANT
SUT PLAIN 2 0 XLH (SUTURE) IMPLANT
SUT VIC AB 0 CTX 36 (SUTURE) ×4
SUT VIC AB 0 CTX36XBRD ANBCTRL (SUTURE) ×4 IMPLANT
SUT VIC AB 2-0 CT1 27 (SUTURE) ×1
SUT VIC AB 2-0 CT1 TAPERPNT 27 (SUTURE) ×1 IMPLANT
SUT VIC AB 4-0 KS 27 (SUTURE) ×2 IMPLANT
TOWEL OR 17X24 6PK STRL BLUE (TOWEL DISPOSABLE) ×2 IMPLANT
TRAY FOLEY W/BAG SLVR 14FR LF (SET/KITS/TRAYS/PACK) ×2 IMPLANT
WATER STERILE IRR 1000ML POUR (IV SOLUTION) ×4 IMPLANT

## 2020-12-09 NOTE — Anesthesia Procedure Notes (Signed)
Spinal  Patient location during procedure: OR Start time: 12/09/2020 11:20 PM End time: 12/09/2020 11:25 PM Staffing Performed: anesthesiologist  Anesthesiologist: Mellody Dance, MD Preanesthetic Checklist Completed: patient identified, IV checked, risks and benefits discussed, surgical consent, monitors and equipment checked, pre-op evaluation and timeout performed Spinal Block Patient position: sitting Prep: DuraPrep Patient monitoring: cardiac monitor, continuous pulse ox and blood pressure Approach: midline Location: L2-3 Injection technique: single-shot Needle Needle type: Pencan  Needle gauge: 24 G Needle length: 9 cm Assessment Sensory level: T4 Additional Notes Functioning IV was confirmed and monitors were applied. Sterile prep and drape, including hand hygiene and sterile gloves were used. The patient was positioned and the spine was prepped. The skin was anesthetized with lidocaine.  Free flow of clear CSF was obtained prior to injecting local anesthetic into the CSF.  The spinal needle aspirated freely following injection.  The needle was carefully withdrawn.  The patient tolerated the procedure well.

## 2020-12-09 NOTE — H&P (Signed)
25 y.o. G1P0 @ [redacted]w[redacted]d with di-di twins presents with painful contractions.  She was seen in the office earlier today with cramping.  Cervix at that time was 2 cm.  Since that time, pelvic pressure, back pain and cramping has increased.  ON arrival to MAU she was 4 cm.    Pregnancy has been complicated by: 1. Di-di twin pregnancy:  Most recent US on 12/29:  A breech EFW 4lb15oz (68%), fetus B obique, EFW 5lb (72%). Growth discordance 1%.  2. PUPPS: on hydroxyzine  Past Medical History:  Diagnosis Date  . Asthma     Past Surgical History:  Procedure Laterality Date  . WISDOM TOOTH EXTRACTION    . WISDOM TOOTH EXTRACTION N/A 2012    OB History  Gravida Para Term Preterm AB Living  1            SAB IAB Ectopic Multiple Live Births               # Outcome Date GA Lbr Len/2nd Weight Sex Delivery Anes PTL Lv  1 Current             Social History   Socioeconomic History  . Marital status: Married    Spouse name: Not on file  . Number of children: Not on file  . Years of education: Not on file  . Highest education level: Not on file  Occupational History  . Not on file  Tobacco Use  . Smoking status: Never Smoker  . Smokeless tobacco: Never Used  Vaping Use  . Vaping Use: Never used  Substance and Sexual Activity  . Alcohol use: Not Currently    Comment: ccasionally  . Drug use: Never  . Sexual activity: Not Currently  Other Topics Concern  . Not on file  Social History Narrative  . Not on file      Patient has no known allergies.    Prenatal Transfer Tool  Maternal Diabetes: No Genetic Screening: Normal Maternal Ultrasounds/Referrals: Fetal renal pyelectasis --Twin A with mild right urinary tract dilation Fetal Ultrasounds or other Referrals:  Referred to Materal Fetal Medicine  Maternal Substance Abuse:  No Significant Maternal Medications:  Meds include: Other:  hydroxyzine Significant Maternal Lab Results: None GBS unknown     Vitals:   12/09/20 2125  12/09/20 2146  BP: (!) 141/85 (!) 108/91  Pulse: (!) 121 (!) 114  Resp: 12   Temp: 98.6 F (37 C)   SpO2: 100%      General:  NAD Abdomen:  soft, gravid SVE: 4-5/100/0/ breech Ex:  trace edema  Toco: q3-4 min FHTs:  A 150s, moderate variability, category 1; B 150s, moderate variability Category 1   A/P   25 y.o. G1P0 [redacted]w[redacted]d with di-di twins presents with preterm labor and malpresentation.  Discussed risks of cesarean section to include, but not limited to, infection, bleeding, damage to surrounding strutcures (including bowel, bladder, tubes, ovaries, nerves, vessels, baby), need for additional procedures, risk of blood clot, need for transfusion. Consent signed.   Ancef 2gm on call to OR  [facility] GEFFEL Chestine Spore

## 2020-12-09 NOTE — MAU Note (Signed)
.   Robyn [name] is a 25 y.o. at [redacted]w[redacted]d here in MAU reporting: Ctx that have been going on for a few days. States she was checked in the office and she was 2-3 and very thin. No VB or LOF. Reports increase in pink colored discharge. States both babies are breech. Endorses good fetal movement.   Pain score: 5 Vitals:   12/09/20 2125  BP: (!) 141/85  Pulse: (!) 121  Resp: 12  Temp: 98.6 F (37 C)  SpO2: 100%     FHT:A-164 B-160 Lab orders placed from triage: UA

## 2020-12-09 NOTE — MAU Note (Signed)
Report given to [facility] or Charge and Anesthesia.

## 2020-12-09 NOTE — Anesthesia Preprocedure Evaluation (Signed)
Anesthesia Evaluation  Patient identified by MRN, date of birth, ID band Patient awake    Reviewed: Allergy & Precautions, H&P , NPO status , Patient's Chart, lab work & pertinent test results  Airway Mallampati: II  TM Distance: >3 FB Neck ROM: Full    Dental no notable dental hx.    Pulmonary asthma ,    Pulmonary exam normal breath sounds clear to auscultation       Cardiovascular negative cardio ROS Normal cardiovascular exam Rhythm:Regular Rate:Normal     Neuro/Psych negative neurological ROS  negative psych ROS   GI/Hepatic negative GI ROS, Neg liver ROS,   Endo/Other  negative endocrine ROS  Renal/GU negative Renal ROS  negative genitourinary   Musculoskeletal negative musculoskeletal ROS (+)   Abdominal   Peds negative pediatric ROS (+)  Hematology negative hematology ROS (+)   Anesthesia Other Findings Diffuse rash x 1-2 weeks consistent with PUPPPS  Reproductive/Obstetrics negative OB ROS                             Anesthesia Physical Anesthesia Plan  ASA: II and emergent  Anesthesia Plan: Spinal   Post-op Pain Management:    Induction:   PONV Risk Score and Plan: 2 and Scopolamine patch - Pre-op, Ondansetron, Dexamethasone and Treatment may vary due to age or medical condition  Airway Management Planned: Natural Airway  Additional Equipment:   Intra-op Plan:   Post-operative Plan:   Informed Consent: I have reviewed the patients History and Physical, chart, labs and discussed the procedure including the risks, benefits and alternatives for the proposed anesthesia with the patient or authorized representative who has indicated his/her understanding and acceptance.       Plan Discussed with: CRNA and Anesthesiologist  Anesthesia Plan Comments: (Patient is contracting and dilating with breech twins. Last ate around 5pm. OB deemed patient's clinical condition  will not allow to wait for appropriate NPO status. I discussed R/B/O with patient including the risk of aspiration. She expressed understanding. Spinal. GETA as backup plan. )        Anesthesia Quick Evaluation

## 2020-12-09 NOTE — MAU Provider Note (Signed)
History     [id number]  Arrival date and time: 12/09/20 2112    Chief Complaint  Patient presents with  . Abdominal Pain     HPI Robyn [name] is a 25 y.o. at [redacted]w[redacted]d PMHx notable for diet twin pregnancy and pump, who presents for contractions.   Review of outside prenatal records from [facility] OB[facility]N office (in media tab): Pregnancy course notable for Mercie Eon twin pregnancy and PUPPS.  Recent check in office on January 7 patient was 2/70%  Patient reports being evaluated in the office earlier today at which time she was 2 to 3 cm and very thin.  Was also having uncomfortable contractions at that time and told to present to MAU if they get worse.  Reports that since she was evaluated in the office contractions and pelvic discomfort have gotten significantly worse.  Has been feeling normal fetal movement though less so in the last few hours.  No large gush of fluid like water is broken.  Has had some spotting but notes that she was checked in the office before and expected have some.  --/--/PENDING (01/13 2142)  OB History    Gravida  1   Para      Term      Preterm      AB      Living        SAB      IAB      Ectopic      Multiple      Live Births              Past Medical History:  Diagnosis Date  . Asthma     Past Surgical History:  Procedure Laterality Date  . WISDOM TOOTH EXTRACTION    . WISDOM TOOTH EXTRACTION N/A 2012    Family History  Problem Relation Age of Onset  . Asthma Mother   . Cancer Maternal Grandmother        endometrial  . Breast cancer Paternal Grandmother     Social History   Socioeconomic History  . Marital status: Married    Spouse name: Not on file  . Number of children: Not on file  . Years of education: Not on file  . Highest education level: Not on file  Occupational History  . Not on file  Tobacco Use  . Smoking status: Never Smoker  . Smokeless tobacco: Never Used  Vaping Use  . Vaping Use: Never used   Substance and Sexual Activity  . Alcohol use: Not Currently    Comment: ccasionally  . Drug use: Never  . Sexual activity: Not Currently  Other Topics Concern  . Not on file  Social History Narrative  . Not on file   Social Determinants of Health   Financial Resource Strain: Not on file  Food Insecurity: Not on file  Transportation Needs: Not on file  Physical Activity: Not on file  Stress: Not on file  Social Connections: Not on file  Intimate Partner Violence: Not on file    No Known Allergies  No current facility-administered medications on file prior to encounter.   Current Outpatient Medications on File Prior to Encounter  Medication Sig Dispense Refill  . acetaminophen (TYLENOL) 500 MG tablet Take 2 tablets (1,000 mg total) by mouth every 6 (six) hours as needed for headache.    . famotidine (PEPCID) 20 MG tablet Take 20 mg by mouth 2 (two) times daily.    . hydrOXYzine (ATARAX/VISTARIL) 25 MG  tablet Take 25 mg by mouth 3 (three) times daily as needed.    . Prenatal-FeFum-FA-DHA w/o A (PRENATAL + DHA PO) Prenatal + DHA    . Elastic Bandages & Supports (COMFORT FIT MATERNITY SUPP SM) MISC 1 Units by Does not apply route daily as needed. 1 each 0  . promethazine (PHENERGAN) 25 MG tablet Take 25 mg by mouth every 6 (six) hours as needed. (Patient not taking: Reported on 11/24/2020)       ROS Pertinent positives and negative per HPI, all others reviewed and negative  Physical Exam   BP 128/76   Pulse (!) 105   Temp 98.6 F (37 C) (Oral)   Resp 12   Wt 80.8 kg   LMP 04/09/2020   SpO2 100%   BMI 29.64 kg/m   Physical Exam Vitals reviewed.  Constitutional:      General: She is not in acute distress.    Appearance: She is well-developed and well-nourished. She is not diaphoretic.     Comments: Appears uncomfortable  Eyes:     General: No scleral icterus. Pulmonary:     Effort: Pulmonary effort is normal. No respiratory distress.  Abdominal:     General:  There is no distension.     Palpations: Abdomen is soft.     Tenderness: There is no abdominal tenderness. There is no guarding or rebound.  Musculoskeletal:        General: No edema.  Skin:    General: Skin is warm and dry.  Neurological:     Mental Status: She is alert.     Coordination: Coordination normal.  Psychiatric:        Mood and Affect: Mood and affect normal.      Cervical Exam Dilation: 4.5 Effacement (%): 90 Exam by:: Dr. Chestine Spore  Bedside Ultrasound Pt informed that the ultrasound is considered a limited OB ultrasound and is not intended to be a complete ultrasound exam.  Patient also informed that the ultrasound is not being completed with the intent of assessing for fetal or placental anomalies or any pelvic abnormalities.  Explained that the purpose of today's ultrasound is to assess for  presentation.  Patient acknowledges the purpose of the exam and the limitations of the study.    My interpretation: Bedside ultrasound demonstrates right-sided infant breech, left-sided infant transverse with head to maternal left.  FHT Twin A Baseline 155, moderate variability, +accels, neg decels Toco: q3-4 min Cat: I  Twin B Baseline 150, moderate variability, +accels, neg decels Toco: q3-4 min Cat: I  Labs Results for orders placed or performed during the hospital encounter of 12/09/20 (from the past 24 hour(s))  Urinalysis, Routine w reflex microscopic Urine, Clean Catch     Status: Abnormal   Collection Time: 12/09/20  9:41 PM  Result Value Ref Range   Color, Urine YELLOW YELLOW   APPearance HAZY (A) CLEAR   Specific Gravity, Urine 1.015 1.005 - 1.030   pH 6.0 5.0 - 8.0   Glucose, UA NEGATIVE NEGATIVE mg/dL   Hgb urine dipstick MODERATE (A) NEGATIVE   Bilirubin Urine NEGATIVE NEGATIVE   Ketones, ur NEGATIVE NEGATIVE mg/dL   Protein, ur NEGATIVE NEGATIVE mg/dL   Nitrite NEGATIVE NEGATIVE   Leukocytes,Ua TRACE (A) NEGATIVE   RBC / HPF 6-10 0 - 5 RBC/hpf   WBC,  UA 0-5 0 - 5 WBC/hpf   Bacteria, UA NONE SEEN NONE SEEN   Squamous Epithelial / LPF 0-5 0 - 5   Mucus PRESENT  Type and screen [facility]     Status: None (Preliminary result)   Collection Time: 12/09/20  9:42 PM  Result Value Ref Range   ABO/RH(D) PENDING    Antibody Screen PENDING    Sample Expiration      12/12/2020,2359 Performed at [facility], [address], [place], [place] [address]   CBC     Status: Abnormal   Collection Time: 12/09/20  9:44 PM  Result Value Ref Range   WBC 14.3 (H) 4.0 - 10.5 K/uL   RBC 3.78 (L) 3.87 - 5.11 MIL/uL   Hemoglobin 12.1 12.0 - 15.0 g/dL   HCT 99.3 (L) 57.0 - 17.7 %   MCV 90.2 80.0 - 100.0 fL   MCH 32.0 26.0 - 34.0 pg   MCHC 35.5 30.0 - 36.0 g/dL   RDW 93.9 03.0 - 09.2 %   Platelets 244 150 - 400 K/uL   nRBC 0.2 0.0 - 0.2 %  Comprehensive metabolic panel     Status: Abnormal   Collection Time: 12/09/20  9:44 PM  Result Value Ref Range   Sodium 130 (L) 135 - 145 mmol/L   Potassium 4.0 3.5 - 5.1 mmol/L   Chloride 99 98 - 111 mmol/L   CO2 19 (L) 22 - 32 mmol/L   Glucose, Bld 83 70 - 99 mg/dL   BUN 5 (L) 6 - 20 mg/dL   Creatinine, Ser 3.30 0.44 - 1.00 mg/dL   Calcium 8.8 (L) 8.9 - 10.3 mg/dL   Total Protein 5.8 (L) 6.5 - 8.1 g/dL   Albumin 2.6 (L) 3.5 - 5.0 g/dL   AST 23 15 - 41 U/L   ALT 14 0 - 44 U/L   Alkaline Phosphatase 168 (H) 38 - 126 U/L   Total Bilirubin 0.6 0.3 - 1.2 mg/dL   GFR, Estimated >07 >62 mL/min   Anion gap 12 5 - 15    Imaging No results found.  MAU Course  Procedures  Lab Orders     Resp Panel by RT-PCR (Flu A&B, Covid) Nasopharyngeal Swab     Urinalysis, Routine w reflex microscopic Urine, Clean Catch     CBC     Comprehensive metabolic panel Meds ordered this encounter  Medications  . sodium citrate-citric acid (ORACIT) solution 30 mL  . ceFAZolin (ANCEF) IVPB 2g/100 mL premix    Order Specific Question:   Indication:    Answer:   Surgical Prophylaxis  . lactated ringers  infusion  . scopolamine (TRANSDERM-SCOP) 1 MG/3DAYS 1.5 mg   Imaging Orders  No imaging studies ordered today    MDM moderate  Assessment and Plan  #Early labor Patient presenting with painful contractions, on cervical exam found to advance from 2-3 in the office this morning to 3 to 4 cm.  Given progression of cervical exam and diet I twin pregnancy discussed with Dr. Chestine Spore who agrees patient should proceed towards delivery.  OR orders placed, OB anesthesia and OR charge nurse notified.  Transferred to Dr. Chestine Spore.  #FWB FHT Cat I NST: Reactive  Venora Maples

## 2020-12-10 ENCOUNTER — Encounter (HOSPITAL_COMMUNITY): Payer: Self-pay | Admitting: Obstetrics and Gynecology

## 2020-12-10 LAB — CBC
HCT: 33.5 % — ABNORMAL LOW (ref 36.0–46.0)
Hemoglobin: 11.3 g/dL — ABNORMAL LOW (ref 12.0–15.0)
MCH: 31.3 pg (ref 26.0–34.0)
MCHC: 33.7 g/dL (ref 30.0–36.0)
MCV: 92.8 fL (ref 80.0–100.0)
Platelets: 232 10*3/uL (ref 150–400)
RBC: 3.61 MIL/uL — ABNORMAL LOW (ref 3.87–5.11)
RDW: 13.7 % (ref 11.5–15.5)
WBC: 18.8 10*3/uL — ABNORMAL HIGH (ref 4.0–10.5)
nRBC: 0 % (ref 0.0–0.2)

## 2020-12-10 MED ORDER — FENTANYL CITRATE (PF) 100 MCG/2ML IJ SOLN: 25.0000 ug | INTRAMUSCULAR | Status: DC | PRN

## 2020-12-10 MED ORDER — SCOPOLAMINE 1 MG/3DAYS TD PT72: 1.0000 | MEDICATED_PATCH | Freq: Once | TRANSDERMAL | Status: DC

## 2020-12-10 MED ORDER — ONDANSETRON HCL 4 MG/2ML IJ SOLN: 4.0000 mg | Freq: Three times a day (TID) | INTRAMUSCULAR | Status: DC | PRN

## 2020-12-10 MED ORDER — OXYTOCIN-SODIUM CHLORIDE 30-0.9 UT/500ML-% IV SOLN
2.5000 [IU]/h | INTRAVENOUS | Status: AC
  Administered 2020-12-10: 2.5 [IU]/h via INTRAVENOUS

## 2020-12-10 MED ORDER — DIPHENHYDRAMINE HCL 25 MG PO CAPS: 25.0000 mg | ORAL_CAPSULE | Freq: Four times a day (QID) | ORAL | Status: DC | PRN

## 2020-12-10 MED ORDER — PHENYLEPHRINE HCL-NACL 20-0.9 MG/250ML-% IV SOLN
INTRAVENOUS | Status: AC
  Filled 2020-12-10: qty 250

## 2020-12-10 MED ORDER — DIPHENHYDRAMINE HCL 25 MG PO CAPS: 25.0000 mg | ORAL_CAPSULE | ORAL | Status: DC | PRN

## 2020-12-10 MED ORDER — SENNOSIDES-DOCUSATE SODIUM 8.6-50 MG PO TABS
2.0000 | ORAL_TABLET | ORAL | Status: DC
  Administered 2020-12-10 – 2020-12-12 (×3): 2 via ORAL
  Filled 2020-12-10 (×3): qty 2

## 2020-12-10 MED ORDER — ONDANSETRON HCL 4 MG/2ML IJ SOLN
INTRAMUSCULAR | Status: AC
  Filled 2020-12-10: qty 2

## 2020-12-10 MED ORDER — LACTATED RINGERS IV SOLN: INTRAVENOUS | Status: DC

## 2020-12-10 MED ORDER — MENTHOL 3 MG MT LOZG: 1.0000 | LOZENGE | OROMUCOSAL | Status: DC | PRN

## 2020-12-10 MED ORDER — COCONUT OIL OIL: 1.0000 "application " | TOPICAL_OIL | Status: DC | PRN

## 2020-12-10 MED ORDER — FAMOTIDINE 20 MG PO TABS: 20.0000 mg | ORAL_TABLET | Freq: Two times a day (BID) | ORAL | Status: DC | PRN

## 2020-12-10 MED ORDER — WITCH HAZEL-GLYCERIN EX PADS: 1.0000 "application " | MEDICATED_PAD | CUTANEOUS | Status: DC | PRN

## 2020-12-10 MED ORDER — AMISULPRIDE (ANTIEMETIC) 5 MG/2ML IV SOLN: 10.0000 mg | Freq: Once | INTRAVENOUS | Status: DC | PRN

## 2020-12-10 MED ORDER — DEXAMETHASONE SODIUM PHOSPHATE 10 MG/ML IJ SOLN
INTRAMUSCULAR | Status: DC | PRN
  Administered 2020-12-09: 10 mg via INTRAVENOUS

## 2020-12-10 MED ORDER — ACETAMINOPHEN 10 MG/ML IV SOLN: 1000.0000 mg | Freq: Once | INTRAVENOUS | Status: DC | PRN

## 2020-12-10 MED ORDER — ACETAMINOPHEN 500 MG PO TABS
1000.0000 mg | ORAL_TABLET | Freq: Four times a day (QID) | ORAL | Status: DC
  Administered 2020-12-10 – 2020-12-13 (×10): 1000 mg via ORAL
  Filled 2020-12-10 (×14): qty 2

## 2020-12-10 MED ORDER — HYDROXYZINE HCL 10 MG PO TABS
10.0000 mg | ORAL_TABLET | Freq: Three times a day (TID) | ORAL | Status: DC | PRN
  Filled 2020-12-10: qty 1

## 2020-12-10 MED ORDER — DEXAMETHASONE SODIUM PHOSPHATE 10 MG/ML IJ SOLN
INTRAMUSCULAR | Status: AC
  Filled 2020-12-10: qty 1

## 2020-12-10 MED ORDER — NALOXONE HCL 0.4 MG/ML IJ SOLN: 0.4000 mg | INTRAMUSCULAR | Status: DC | PRN

## 2020-12-10 MED ORDER — SIMETHICONE 80 MG PO CHEW
80.0000 mg | CHEWABLE_TABLET | ORAL | Status: DC | PRN
  Administered 2020-12-11: 80 mg via ORAL
  Filled 2020-12-10: qty 1

## 2020-12-10 MED ORDER — ACETAMINOPHEN 500 MG PO TABS: 1000.0000 mg | ORAL_TABLET | Freq: Four times a day (QID) | ORAL | Status: DC

## 2020-12-10 MED ORDER — NALBUPHINE HCL 10 MG/ML IJ SOLN: 5.0000 mg | INTRAMUSCULAR | Status: DC | PRN

## 2020-12-10 MED ORDER — NALBUPHINE HCL 10 MG/ML IJ SOLN: 5.0000 mg | Freq: Once | INTRAMUSCULAR | Status: DC | PRN

## 2020-12-10 MED ORDER — OXYTOCIN-SODIUM CHLORIDE 30-0.9 UT/500ML-% IV SOLN
INTRAVENOUS | Status: AC
  Filled 2020-12-10: qty 500

## 2020-12-10 MED ORDER — DIPHENHYDRAMINE HCL 50 MG/ML IJ SOLN: 12.5000 mg | INTRAMUSCULAR | Status: DC | PRN

## 2020-12-10 MED ORDER — KETOROLAC TROMETHAMINE 30 MG/ML IJ SOLN
30.0000 mg | Freq: Four times a day (QID) | INTRAMUSCULAR | Status: AC | PRN
  Administered 2020-12-10: 30 mg via INTRAVENOUS

## 2020-12-10 MED ORDER — PRENATAL MULTIVITAMIN CH
1.0000 | ORAL_TABLET | Freq: Every day | ORAL | Status: DC
  Administered 2020-12-10 – 2020-12-12 (×3): 1 via ORAL
  Filled 2020-12-10 (×3): qty 1

## 2020-12-10 MED ORDER — DIBUCAINE (PERIANAL) 1 % EX OINT: 1.0000 "application " | TOPICAL_OINTMENT | CUTANEOUS | Status: DC | PRN

## 2020-12-10 MED ORDER — KETOROLAC TROMETHAMINE 30 MG/ML IJ SOLN
30.0000 mg | Freq: Four times a day (QID) | INTRAMUSCULAR | Status: AC
  Administered 2020-12-10 – 2020-12-11 (×4): 30 mg via INTRAVENOUS
  Filled 2020-12-10 (×4): qty 1

## 2020-12-10 MED ORDER — IBUPROFEN 800 MG PO TABS
800.0000 mg | ORAL_TABLET | Freq: Four times a day (QID) | ORAL | Status: DC
  Administered 2020-12-11 – 2020-12-13 (×9): 800 mg via ORAL
  Filled 2020-12-10 (×9): qty 1

## 2020-12-10 MED ORDER — KETOROLAC TROMETHAMINE 30 MG/ML IJ SOLN
INTRAMUSCULAR | Status: AC
  Filled 2020-12-10: qty 1

## 2020-12-10 MED ORDER — OXYCODONE HCL 5 MG/5ML PO SOLN: 5.0000 mg | Freq: Once | ORAL | Status: DC | PRN

## 2020-12-10 MED ORDER — SIMETHICONE 80 MG PO CHEW
80.0000 mg | CHEWABLE_TABLET | Freq: Three times a day (TID) | ORAL | Status: DC
  Administered 2020-12-10 – 2020-12-12 (×6): 80 mg via ORAL
  Filled 2020-12-10 (×7): qty 1

## 2020-12-10 MED ORDER — OXYCODONE HCL 5 MG PO TABS: 5.0000 mg | ORAL_TABLET | Freq: Once | ORAL | Status: DC | PRN

## 2020-12-10 MED ORDER — PROMETHAZINE HCL 25 MG/ML IJ SOLN: 6.2500 mg | INTRAMUSCULAR | Status: DC | PRN

## 2020-12-10 MED ORDER — MEPERIDINE HCL 25 MG/ML IJ SOLN
6.2500 mg | INTRAMUSCULAR | Status: DC | PRN
Start: 2020-12-10 — End: 2020-12-10

## 2020-12-10 MED ORDER — KETOROLAC TROMETHAMINE 30 MG/ML IJ SOLN: 30.0000 mg | Freq: Four times a day (QID) | INTRAMUSCULAR | Status: AC | PRN

## 2020-12-10 MED ORDER — SODIUM CHLORIDE 0.9 % IR SOLN
Status: DC | PRN
  Administered 2020-12-10: 1

## 2020-12-10 MED ORDER — ONDANSETRON HCL 4 MG/2ML IJ SOLN
INTRAMUSCULAR | Status: DC | PRN
  Administered 2020-12-09: 4 mg via INTRAVENOUS

## 2020-12-10 MED ORDER — OXYCODONE HCL 5 MG PO TABS: 5.0000 mg | ORAL_TABLET | ORAL | Status: DC | PRN

## 2020-12-10 MED ORDER — SODIUM CHLORIDE 0.9% FLUSH: 3.0000 mL | INTRAVENOUS | Status: DC | PRN

## 2020-12-10 MED ORDER — NALOXONE HCL 4 MG/10ML IJ SOLN
1.0000 ug/kg/h | INTRAMUSCULAR | Status: DC | PRN
  Filled 2020-12-10: qty 5

## 2020-12-10 NOTE — Progress Notes (Signed)
Patient screened out for psychosocial assessment since none of the following apply:  Psychosocial stressors documented in mother or baby's chart  Gestation less than 32 weeks  Code at delivery   Infant with anomalies Please contact the Clinical Social Worker if specific needs arise, by MOB's request, or if MOB scores greater than 9/yes to question 10 on Edinburgh Postpartum Depression Screen.  Wilfred Dayrit, LCSW Clinical Social Worker [facility] Cell#: [phone]     

## 2020-12-10 NOTE — Transfer of Care (Signed)
Immediate Anesthesia Transfer of Care Note  Patient: Robyn [name]  Procedure(s) Performed: CESAREAN SECTION (N/A )  Patient Location: PACU  Anesthesia Type:Spinal  Level of Consciousness: awake, alert , oriented and patient cooperative  Airway & Oxygen Therapy: Patient Spontanous Breathing  Post-op Assessment: Report given to RN and Post -op Vital signs reviewed and stable  Post vital signs: Reviewed and stable  Last Vitals:  Vitals Value Taken Time  BP    Temp    Pulse    Resp    SpO2      Last Pain:  Vitals:   12/09/20 2125  TempSrc: Oral  PainSc: 5          Complications: No complications documented.

## 2020-12-10 NOTE — Progress Notes (Signed)
Subjective: Postpartum Day 1: Cesarean Delivery Patient reports tolerating PO.  Foley in place. No concerns  Objective: Vitals:   12/10/20 0700 12/10/20 0705 12/10/20 0710 12/10/20 0719  BP:    114/67  Pulse:    65  Resp:    17  Temp:    98.3 F (36.8 C)  TempSrc:    Oral  SpO2: 97% 97% 97% 98%  Weight:       UOP 125cc/h  Physical Exam:  General: alert and no distress Lochia: appropriate Uterine Fundus: firm Incision: dressing clean and dry DVT Evaluation: No evidence of DVT seen on physical exam.  Recent Labs    12/09/20 2144 12/10/20 0608  HGB 12.1 11.3*  HCT 34.1* 33.5*    Assessment/Plan: Status post Cesarean section. Doing well postoperatively. Continue current care Robyn [name] [id number] POD#1 sp CS at [redacted]w[redacted]d DiDi twins with preterm labor 1. PUPPS: on hydroxyzine  3. Vaccines: s/p tdap, flu in pregnancy. COVID vaccines 02/2020, and booster.  Rubella immune, blood type A POS, pumping, baby boys in the NICU  Robyn [name] K Taam-Akelman 12/10/2020, 8:37 AM

## 2020-12-10 NOTE — Anesthesia Postprocedure Evaluation (Signed)
Anesthesia Post Note  Patient: Robyn [name]  Procedure(s) Performed: CESAREAN SECTION (N/A )     Patient location during evaluation: Mother Baby Anesthesia Type: Spinal Level of consciousness: oriented and awake and alert Pain management: pain level controlled Vital Signs Assessment: post-procedure vital signs reviewed and stable Respiratory status: spontaneous breathing and respiratory function stable Cardiovascular status: blood pressure returned to baseline and stable Postop Assessment: no headache, no backache, no apparent nausea or vomiting and spinal receding Anesthetic complications: no   No complications documented.  Last Vitals:  Vitals:   12/10/20 0350 12/10/20 0433  BP:  117/73  Pulse:  62  Resp:  18  Temp:  36.8 C  SpO2: 96% 97%    Last Pain:  Vitals:   12/10/20 0433  TempSrc: Oral  PainSc: 0-No pain   Pain Goal:                   Mellody Dance

## 2020-12-10 NOTE — Op Note (Signed)
Cesarean Section Procedure Note  Pre-operative Diagnosis: 1. Intrauterine pregnancy at [redacted]w[redacted]d  2. Mercie Eon twin pregnancy  3.  Preterm labor  4. Breech presentation of twin A  Post-operative Diagnosis: same as above  Surgeon: Marlow Baars, MD  Procedure: Primary low transverse cesarean section   Anesthesia: Spinal anesthesia  Estimated Blood Loss: 777 mL         Drains: Foley catheter         Specimens: placenta to pathology              Complications:  None; patient tolerated the procedure well.         Disposition: PACU - hemodynamically stable.  Findings:  Normal uterus, tubes and ovaries bilaterally.  [name] viable female infant, 42g (6lb 4.9oz) Apgars 8, 9 delivered in the frank breech presentation  Baby B viable female infant 40g (5lb 7.1oz) Apgars 6, 8 delivered in the footling breech presentation  Procedure Details   After spinal  anesthesia was found to adequate, the patient was placed in the dorsal supine position with a leftward tilt, prepped and draped in the usual sterile manner. A Pfannenstiel incision was made and carried down through the subcutaneous tissue to the fascia.  The fascia was incised in the midline and the fascial incision was extended laterally with Mayo scissors. The superior aspect of the fascial incision was grasped with two Kocher clamps, tented up and the rectus muscles dissected off sharply. The rectus was then dissected off with blunt dissection and Mayo scissors inferiorly. The rectus muscles were separated in the midline. The abdominal peritoneum was identified, tented up, entered bluntly, and the incision was extended superiorly and inferiorly with good visualization of the bladder. The Alexis retractor was deployed. The vesicouterine peritoneum was identified, tented up, entered sharply, and the bladder flap was created digitally. A scalpel was then used to make a low transverse incision on the uterus which was extended in the cephalad-caudad direction  with blunt dissection. The fluid was clear. The fetal breech of twin A was identified, elevated out of the pelvis and brought to the hysterotomy.  The baby was delivered in the frank breech position via the usual maneuvers.  After a 60 second delay per protocol, the cord was clamped and cut and the infant was passed to the waiting neonatologist.  During the delayed cord clamping for [name], rupture of membranes of the second sac was performed with clear fluid.  Baby B was delivered via the footling breech position.  After a 60 second delay per protocol, the cord was clamped and cut and the infant was passed to the waiting neonatologist. The placenta was then delivered spontaneously, intact and appear normal, the uterus was cleared of all clot and debris   The hysterotomy was repaired with #0 Monocryl in running locked fashion. Uterine tone was quite boggy, so a dose of 0.2 mg IM methergine was given.   A second imbricating layer of #0 Monocryl was placed. Excellent hemostasis was noted.  Uterine tone was much improved. The Alexis retractor was removed from the abdomen. The peritoneum was examined and all vessels noted to be hemostatic. The abdominal cavity was cleared of all clot and debris.  The peritoneum was closed with 2-0 vicryl in a running fashion.  The rectus muscles were then closed with 2-0 Vicryl. The fascia and rectus muscles were inspected and were hemostatic. The fascia was closed with 0 Vicryl in a running fashion. The subcutaneous layer was irrigated and all  bleeders cauterized. The subcutaneous layer was closed with interrupted plain gut. The skin was closed with 4-0 Vicryl in a subcuticular fashion. The incision was dressed with benzoine, steri strips and honeycomb dressing. All sponge lap and needle counts were correct x3. Patient tolerated the procedure well and recovered in stable condition following the procedure.

## 2020-12-10 NOTE — Progress Notes (Signed)
Tylenol fell in floor out of pts hand  Wasted  The dose and repeated  Med from pyxis

## 2020-12-10 NOTE — Lactation Note (Signed)
This note was copied from a baby's chart. Lactation Consultation Note  Patient Name: Robyn [name] [id number] Date: 12/10/2020 Reason for consult: Initial assessment;Late-preterm 34-36.6wks;NICU baby;Infant < 6lbs;Primapara;1st time breastfeeding;Multiple gestation Age:26 hours   P2 mother whose infant twin boys are now 68 hours old.  These are LPTIs born at 34+6 weeks with a CGA of 35+0 weeks.    Mother is interested in going to the NICU to visit her sons, however, she was willing to initiate pumping prior to going to the NICU.    Pump set up, assembly, disassembly and cleaning reviewed.  Observed mother pumping with #24 flanges and these are appropriate at this time.  Encouraged breast massage and hand expression before/after pumping to help establish a good milk supply.  Mother has not been taught hand expression so I asked her to call me when she returns from NICU.  At that time I will gladly review and provide further education.  It is not imperative that I delay family's visit to the NICU at this time.  Thanked mother for initiating the pumping.  Father and grandmother present and supportive.  RN in room to provide medication.  Mother will call when she returns.     Maternal Data Formula Feeding for Exclusion: No Has patient been taught Hand Expression?: No (Mother interested in going to NICU; asked her to call for instruction when she returns) Does the patient have breastfeeding experience prior to this delivery?: No  Feeding    LATCH Score                   Interventions    Lactation Tools Discussed/Used Pump Education: Setup, frequency, and cleaning;Milk Storage Initiated by:: Bricen Victory Date initiated:: 12/10/20   Consult Status Consult Status: Follow-up Date: 12/11/20 Follow-up type: In-patient    Demetri Goshert R Jene Huq 12/10/2020, 12:20 PM

## 2020-12-11 DIAGNOSIS — Z98891 History of uterine scar from previous surgery: Secondary | ICD-10-CM

## 2020-12-11 NOTE — Lactation Note (Signed)
This note was copied from a baby's chart. Lactation Consultation Note  Patient Name: Robyn [name] [id number] Date: 12/11/2020 Reason for consult: Follow-up assessment;NICU baby;Multiple gestation Age:25 years  LC to mother's room for f/u visit. Mother is pumping q 3 hours. We discussed IDF prn. Patient was provided with the opportunity to ask questions. All concerns were addressed.  She will likely d/c today and room in with twins. Will plan f/u visit in NICU.   Consult Status Consult Status: Follow-up Follow-up type: In-patient   Elder Negus, MA IBCLC 12/11/2020, 2:04 PM

## 2020-12-11 NOTE — Discharge Summary (Incomplete)
Postpartum Discharge Summary  Date of Service updated***     Patient Name: Robyn [name] DOB: 1996-08-18 MRN: [id number]  Date of admission: 12/09/2020 Delivery date:   Robyn [name], Robyn [name] [[id number]]  12/09/2020    Robyn [name], Robyn [name] [[id number]]  12/09/2020   Delivering provider:    Tempie [name] [[id number]]  Robyn [name]    Robyn [name], Robyn [name] [[id number]]  Robyn [name]   Date of discharge: 12/11/2020  Admitting diagnosis: Dichorionic diamniotic twin pregnancy in third trimester [O30.043] Twin gestation in third trimester [O30.003] Preterm labor [O60.00] Intrauterine pregnancy: [redacted]w[redacted]d    Secondary diagnosis:  Active Problems:   Dichorionic diamniotic twin pregnancy in third trimester   Twin gestation in third trimester   Preterm labor   S/P C-section  Additional problems: ***    Discharge diagnosis: {DX.:23714}                                              Post partum procedures:{Postpartum procedures:23558} Augmentation: {{NIDPOEUMPNTI:14431}Complications: {OB Labor/Delivery Complications:20784}  Hospital course: {Courses:23701}  Magnesium Sulfate received: {Mag received:30440022} BMZ received: {BMZ received:30440023} Rhophylac:{Rhophylac received:30440032} MVQM:{GQQ:76195093}T-DaP:{Tdap:23962} Flu: {{OIZ:12458}Transfusion:{Transfusion received:30440034}  Physical exam  Vitals:   12/10/20 2359 12/11/20 0000 12/11/20 0419 12/11/20 0725  BP: 111/62 111/62 103/73 114/61  Pulse: 63 63 67 74  Resp: 16  16 17   Temp: 97.9 F (36.6 C)  97.8 F (36.6 C) 98.2 F (36.8 C)  TempSrc: Oral  Axillary Oral  SpO2:  97%    Weight:       General: {Exam; general:21111117} Lochia: {Desc; appropriate/inappropriate:30686::"appropriate"} Uterine Fundus: {Desc; firm/soft:30687} Incision: {Exam; incision:21111123} DVT Evaluation: {Exam; dvt:2111122} Labs: Lab Results  Component Value Date   WBC 18.8 (H) 12/10/2020   HGB 11.3 (L) 12/10/2020   HCT 33.5 (L)  12/10/2020   MCV 92.8 12/10/2020   PLT 232 12/10/2020   CMP Latest Ref Rng & Units 12/09/2020  Glucose 70 - 99 mg/dL 83  BUN 6 - 20 mg/dL 5(L)  Creatinine 0.44 - 1.00 mg/dL 0.61  Sodium 135 - 145 mmol/L 130(L)  Potassium 3.5 - 5.1 mmol/L 4.0  Chloride 98 - 111 mmol/L 99  CO2 22 - 32 mmol/L 19(L)  Calcium 8.9 - 10.3 mg/dL 8.8(L)  Total Protein 6.5 - 8.1 g/dL 5.8(L)  Total Bilirubin 0.3 - 1.2 mg/dL 0.6  Alkaline Phos 38 - 126 U/L 168(H)  AST 15 - 41 U/L 23  ALT 0 - 44 U/L 14   Edinburgh Score: Edinburgh Postnatal Depression Scale Screening Tool 12/10/2020  I have been able to laugh and see the funny side of things. 0  I have looked forward with enjoyment to things. 0  I have blamed myself unnecessarily when things went wrong. 0  I have been anxious or worried for no good reason. 0  I have felt scared or panicky for no good reason. 0  Things have been getting on top of me. 0  I have been so unhappy that I have had difficulty sleeping. 0  I have felt sad or miserable. 0  I have been so unhappy that I have been crying. 0  The thought of harming myself has occurred to me. 0  Edinburgh Postnatal Depression Scale Total 0      After visit meds:  Allergies as of 12/11/2020   No Known Allergies   Med Rec must be  completed prior to using this [facility]***        Discharge home in stable condition Infant Feeding: {Baby feeding:23562} Infant Disposition:{CHL IP OB HOME WITH WLKHVF:47340} Discharge instruction: per After Visit Summary and Postpartum booklet. Activity: Advance as tolerated. Pelvic rest for 6 weeks.  Diet: {OB diet:21111121} Anticipated Birth Control: {Birth Control:23956} Postpartum Appointment:{Outpatient follow up:23559} Additional Postpartum F/U: {PP Procedure:23957} Future Appointments:No future appointments. Follow up Visit:  Follow-up Information    Robyn Charles, MD. Schedule an appointment as soon as possible for a visit in 4 week(s).   Specialty:  Obstetrics Contact information: [address] [place] [address] [phone]                   12/11/2020 Robyn Sidle, MD

## 2020-12-11 NOTE — Progress Notes (Signed)
Subjective: Postpartum Day 2: Cesarean Delivery Patient reports tolerating PO. Voiding spont, ambulating. No issues with pain.   Objective: Vitals:   12/10/20 2359 12/11/20 0000 12/11/20 0419 12/11/20 0725  BP: 111/62 111/62 103/73 114/61  Pulse: 63 63 67 74  Resp: 16  16 17   Temp: 97.9 F (36.6 C)  97.8 F (36.6 C) 98.2 F (36.8 C)  TempSrc: Oral  Axillary Oral  SpO2:  97%    Weight:       Physical Exam:  General: alert and no distress Lochia: appropriate Uterine Fundus: firm Incision: dressing clean and dry DVT Evaluation: No evidence of DVT seen on physical exam.  Recent Labs    12/09/20 2144 12/10/20 0608  HGB 12.1 11.3*  HCT 34.1* 33.5*    Assessment/Plan: Status post Cesarean section. Doing well postoperatively. Discharge home today or tomorrow.  Robyn [name] POD#2 sp CS at [redacted]w[redacted]d DiDi twins with preterm labor 1. POC: routine POC. EBL 777cc, Hgb 12.1>11.3 2. PUPPS: on hydroxyzine  3. Vaccines: s/p tdap, flu in pregnancy. COVID vaccines 02/2020, and booster.  Rubella immune, blood type A POS, pumping, baby boys in the NICU  Robyn [name] 12/11/2020, 9:45 AM

## 2020-12-11 NOTE — Discharge Instructions (Signed)
Prescriptions Motrin 800mg every 8 hours for pain Acetaminophen 650mg every 6 hours for moderate pain Percocet (Oxycodone 5mg-acetaminophen 325mg) every 4 hours for severe pain.  Make sure to not exceed acetaminophen 3000mg every day.  Postpartum Care After Cesarean Delivery This sheet gives you information about how to care for yourself from the time you deliver your baby to up to 6-12 weeks after delivery (postpartum period). Your health care provider may also give you more specific instructions. If you have problems or questions, contact your health care provider. Follow these instructions at home: Medicines  Take over-the-counter and prescription medicines only as told by your health care provider.  If you were prescribed an antibiotic medicine, take it as told by your health care provider. Do not stop taking the antibiotic even if you start to feel better.  Ask your health care provider if the medicine prescribed to you: ? Requires you to avoid driving or using heavy machinery. ? Can cause constipation. You may need to take actions to prevent or treat constipation, such as:  Drink enough fluid to keep your urine pale yellow.  Take over-the-counter or prescription medicines.  Eat foods that are high in fiber, such as beans, whole grains, and fresh fruits and vegetables.  Limit foods that are high in fat and processed sugars, such as fried or sweet foods. Activity  Gradually return to your normal activities as told by your health care provider.  Avoid activities that take a lot of effort and energy (are strenuous) until approved by your health care provider. Walking at a slow to moderate pace is usually safe. Ask your health care provider what activities are safe for you. ? Do not lift anything that is heavier than your baby or 10 lb (4.5 kg) as told by your health care provider. ? Do not vacuum, climb stairs, or drive a car for as long as told by your health care provider.  If  possible, have someone help you at home until you are able to do your usual activities yourself.  Rest as much as possible. Try to rest or take naps while your baby is sleeping. Vaginal bleeding  It is normal to have vaginal bleeding (lochia) after delivery. Wear a sanitary pad to absorb vaginal bleeding and discharge. ? During the first week after delivery, the amount and appearance of lochia is often similar to a menstrual period. ? Over the next few weeks, it will gradually decrease to a dry, yellow-brown discharge. ? For most women, lochia stops completely by 4-6 weeks after delivery. Vaginal bleeding can vary from woman to woman.  Change your sanitary pads frequently. Watch for any changes in your flow, such as: ? A sudden increase in volume. ? A change in color. ? Large blood clots.  If you pass a blood clot, save it and call your health care provider to discuss. Do not flush blood clots down the toilet before you get instructions from your health care provider.  Do not use tampons or douches until your health care provider says this is safe.  If you are not breastfeeding, your period should return 6-8 weeks after delivery. If you are breastfeeding, your period may return anytime between 8 weeks after delivery and the time that you stop breastfeeding. Perineal care  If your C-section (Cesarean section) was unplanned, and you were allowed to labor and push before delivery, you may have pain, swelling, and discomfort of the tissue between your vaginal opening and your anus (perineum). You may   also have an incision in the tissue (episiotomy) or the tissue may have torn during delivery. Follow these instructions as told by your health care provider: ? Keep your perineum clean and dry as told by your health care provider. Use medicated pads and pain-relieving sprays and creams as directed. ? If you have an episiotomy or vaginal tear, check the area every day for signs of infection. Check  for:  Redness, swelling, or pain.  Fluid or blood.  Warmth.  Pus or a bad smell. ? You may be given a squirt bottle to use instead of wiping to clean the perineum area after you go to the bathroom. As you start healing, you may use the squirt bottle before wiping yourself. Make sure to wipe gently. ? To relieve pain caused by an episiotomy, vaginal tear, or hemorrhoids, try taking a warm sitz bath 2-3 times a day. A sitz bath is a warm water bath that is taken while you are sitting down. The water should only come up to your hips and should cover your buttocks.   Breast care  Within the first few days after delivery, your breasts may feel heavy, full, and uncomfortable (breast engorgement). You may also have milk leaking from your breasts. Your health care provider can suggest ways to help relieve breast discomfort. Breast engorgement should go away within a few days.  If you are breastfeeding: ? Wear a bra that supports your breasts and fits you well. ? Keep your nipples clean and dry. Apply creams and ointments as told by your health care provider. ? You may need to use breast pads to absorb milk leakage. ? You may have uterine contractions every time you breastfeed for several weeks after delivery. Uterine contractions help your uterus return to its normal size. ? If you have any problems with breastfeeding, work with your health care provider or a lactation consultant.  If you are not breastfeeding: ? Avoid touching your breasts as this can make your breasts produce more milk. ? Wear a well-fitting bra and use cold packs to help with swelling. ? Do not squeeze out (express) milk. This causes you to make more milk. Intimacy and sexuality  Ask your health care provider when you can engage in sexual activity. This may depend on your: ? Risk of infection. ? Healing rate. ? Comfort and desire to engage in sexual activity.  You are able to get pregnant after delivery, even if you have  not had your period. If desired, talk with your health care provider about methods of family planning or birth control (contraception). Lifestyle  Do not use any products that contain nicotine or tobacco, such as cigarettes, e-cigarettes, and chewing tobacco. If you need help quitting, ask your health care provider.  Do not drink alcohol, especially if you are breastfeeding. Eating and drinking  Drink enough fluid to keep your urine pale yellow.  Eat high-fiber foods every day. These may help prevent or relieve constipation. High-fiber foods include: ? Whole grain cereals and breads. ? Brown rice. ? Beans. ? Fresh fruits and vegetables.  Take your prenatal vitamins until your postpartum checkup or until your health care provider tells you it is okay to stop.   General instructions  Keep all follow-up visits for you and your baby as told by your health care provider. Most women visit their health care provider for a postpartum checkup within the first 3-6 weeks after delivery. Contact a health care provider if you:  Feel unable to cope   with the changes that a new baby brings to your life, and these feelings do not go away.  Feel unusually sad or worried.  Have breasts that are painful, hard, or turn red.  Have a fever.  Have trouble holding urine or keeping urine from leaking.  Have little or no interest in activities you used to enjoy.  Have not breastfed at all and you have not had a menstrual period for 12 weeks after delivery.  Have stopped breastfeeding and you have not had a menstrual period for 12 weeks after you stopped breastfeeding.  Have questions about caring for yourself or your baby.  Pass a blood clot from your vagina. Get help right away if you:  Have chest pain.  Have difficulty breathing.  Have sudden, severe leg pain.  Have severe pain or cramping in your abdomen.  Bleed from your vagina so much that you fill more than one sanitary pad in one hour.  Bleeding should not be heavier than your heaviest period.  Develop a severe headache.  Faint.  Have blurred vision or spots in your vision.  Have a bad-smelling vaginal discharge.  Have thoughts about hurting yourself or your baby. If you ever feel like you may hurt yourself or others, or have thoughts about taking your own life, get help right away. You can go to your nearest emergency department or call:  Your local emergency services (911 in the [place]  A suicide crisis helpline, such as the National Suicide Prevention Lifeline at 1-800-273-8255. This is open 24 hours a day. Summary  The period of time from when you deliver your baby to up to 6-12 weeks after delivery is called the postpartum period.  Gradually return to your normal activities as told by your health care provider.  Keep all follow-up visits for you and your baby as told by your health care provider. This information is not intended to replace advice given to you by your health care provider. Make sure you discuss any questions you have with your health care provider. Document Revised: 07/03/2018 Document Reviewed: 07/03/2018 Elsevier Patient Education  2021 [employer] Inc.  

## 2020-12-12 NOTE — Progress Notes (Signed)
C/o sharp pain with inspiration under right breast. States started just a few minutes ago. VS WNL; encouraged ambulation to facilitate passing flatus.  Dr. Claudine Mouton notified; MD advised hourly use of of IS and to take scheduled simethicone and to notify her if pain resumes after now subsiding with rest. Patient advised of MD instructons.

## 2020-12-12 NOTE — Progress Notes (Signed)
Subjective: Postpartum Day 3: Cesarean Delivery Patient reports tolerating PO. Voiding spont, ambulating. Reports pain worsened yesterday, and working on pumping. Passing flatus, had a BM  Objective: Vitals:   12/11/20 1815 12/11/20 2026 12/11/20 2128 12/12/20 0629  BP: 113/65 124/73  113/78  Pulse: 96 (!) 117  76  Resp: 17 20  17   Temp: 98.2 F (36.8 C) 100.2 F (37.9 C) 98.3 F (36.8 C) 97.7 F (36.5 C)  TempSrc: Oral Oral Oral Oral  SpO2: 99% 100%  99%  Weight:       Physical Exam:  General: alert and no distress Lochia: appropriate Uterine Fundus: firm Incision: dressing clean and dry DVT Evaluation: No evidence of DVT seen on physical exam.  Recent Labs    12/09/20 2144 12/10/20 0608  HGB 12.1 11.3*  HCT 34.1* 33.5*    Assessment/Plan: 12/12/20 G1P0102 POD#2 sp CS at [redacted]w[redacted]d DiDi twins with preterm labor. Plan to continue to monitor today 2/2 postop pain, working on pumping and babies in NICU. Plan for d/c 12/13/2020 1. POC: routine POC. EBL 777cc, Hgb 12.1>11.3. Encouraged ambulation.  2. PUPPS: on hydroxyzine  3. Vaccines: s/p tdap, flu in pregnancy. COVID vaccines 02/2020, and booster.  Rubella immune, blood type A POS, pumping, baby boys in the NICU Robyn [name] 12/12/2020, 7:24 AM

## 2020-12-12 NOTE — Lactation Note (Signed)
This note was copied from a baby's chart. Lactation Consultation Note  Patient Name: Robyn [name] ELFYB'O Date: 12/12/2020 Reason for consult: Follow-up assessment;Primapara;1st time breastfeeding;Multiple gestation;Infant < 6lbs;NICU baby Age:26 hours  LC in to assist with first STS and first time at the breast.  Both babies are exclusively being gavage fed EBM+/donor milk.  Mom and FOB in NICU.  Mom to be discharged tomorrow.  Mom has been consistently pumping and expressing 5 ml.  Mom states she has a Medela DEBP at home.  Parents aware of Medela Symphony rental available from hospital gift shop.  Baby B- Paxton placed STS for first time.  Reviewed breast massage and hand expression, colostrum easily expressed.  Baby positioned in laid back and showed some subtle cues.  Positioned baby in cross cradle hold guiding Mom's hands to support her breast and support baby's head.  Baby opened and latched onto areola and gave a few sucks on and off before tiring.  Unable to identify any swallowing currently.  Baby stayed STS on Mom for about 20 mins until [name]-Theo started showing cues. Paxton placed STS on FOB.  [name]-Theo placed STS in cross cradle hold.  Mom hand expressed drops of milk and baby opened very wide and latched deeply, giving a few sucks, no swallows.  Baby tired after about 5 mins and remained STS on Mom's chest.    Talked about what feeding cues to watch for.  Parents also educated on signs of baby being over stressed and how to handle this.  Parents are very receptive to teaching.    Encouraged Mom to pump after STS or 8 or more times per 24 hrs.    Interventions Interventions: Breast feeding basics reviewed;Assisted with latch;Skin to skin;Breast massage;Hand express;Breast compression;Adjust position;Support pillows;Position options;Expressed milk;DEBP  Lactation Tools Discussed/Used Tools: Pump Breast pump type: Double-Electric Breast Pump   Consult  Status Consult Status: Follow-up Date: 12/13/20 Follow-up type: In-patient    Robyn [name] 12/12/2020, 11:27 AM

## 2020-12-13 NOTE — Plan of Care (Signed)
Pt to be discharged with printed instructions. .me

## 2020-12-13 NOTE — Discharge Summary (Signed)
Postpartum Discharge Summary   Patient Name: Robyn [name] DOB: 08-31-1996 MRN: [id number]  Date of admission: 12/09/2020 Delivery date:   Robyn [name] [[id number]]  12/09/2020    Robyn [name] [[id number]]  12/09/2020   Delivering provider:    Tempie [name] [[id number]]  Robyn [name]    Robyn [name] [[id number]]  Robyn [name]   Date of discharge: 12/13/2020  Admitting diagnosis: Dichorionic diamniotic twin pregnancy in third trimester [O30.043] Twin gestation in third trimester [O30.003] Preterm labor [O60.00] Intrauterine pregnancy: [redacted]w[redacted]d    Secondary diagnosis:  Active Problems:   Dichorionic diamniotic twin pregnancy in third trimester   Twin gestation in third trimester   Preterm labor   S/P C-section  Additional problems: PUPPPs rash    Discharge diagnosis: Preterm Pregnancy Delivered and di/di twins                                              Post partum procedures:none Augmentation: N/A Complications: None  Hospital course: Onset of labor with Sceduled C/S   25y.o. yo G1P0102 at 377w6das admitted to the hospital 12/09/2020 for scheduled cesarean section with the following indication:Malpresentation and Multifetal Gestation.Delivery details are as follows:  Membrane Rupture Time/Date:    HaMamie, Hundertmark0[[id number]]11:43 PM    HaSavannah, Morford0[[id number]]11:44 PM  ,   HaShayne, Deerman0[[id number]]12/09/2020    HaMika, Griffitts0[[id number]]12/09/2020    Delivery Method:   Robyn Donning0[[id number]]C-Section, Low Transverse    HaSherisse, Fullilove0[[id number]]C-Section, Low Transverse   Details of operation can be found in separate operative note.  Patient had an uncomplicated postpartum course.  She is ambulating, tolerating a regular diet, passing flatus, and urinating well. Patient is discharged home in stable condition on  12/13/20        Newborn Data: Birth date:   HaNikeria, [name][[id number]]12/09/2020    HaAzriella, Mattia0[[id number]]12/09/2020   Birth time:   HaCharee, Tumblin0[[id number]]11:44 PM    HaAudrie Lia0[[id number]]11:45 PM   Gender:   HaJohniya, Durfee0[[id number]]Female    HaMalyn, Aytes0[[id number]]Female   Living status:   HaSloka, Volante0[[id number]]Living    HaNadiah, Corbit0[[id number]]Living   Apgars:   HaDezyre, Hoefer0[[id number]]8 [address]0[[id number]]6  ,   HaLorilei, Horan0[[id number]]9    [place]Feiga, [place]0[[id number]]8   Weight:   HaJuliani, Laduke0[[id number]]2860 g    HaKruti, Horacek0[[id number]]2470 g      Magnesium Sulfate received: No BMZ received: No Rhophylac:No MMR:N/A T-DaP:Given prenatally Flu: Yes Transfusion:No  Physical exam  Vitals:   12/12/20 0850 12/12/20 1812 12/12/20 2023 12/12/20 2304  BP: 113/68 113/77 130/77 115/74  Pulse: 79 (!) 104 (!) 109 97  Resp: _0 Temp: 97.8 F (36.6 C) 98.4 F (36.9 C) 98.4 F (36.9 C) 98.1 F (36.7 C)  TempSrc:  Oral Oral Oral  SpO2: 100% 100% 100% 99%  Weight:       General: alert, cooperative and no distress Lochia: appropriate Uterine Fundus: firm  Incision: Healing well with no significant drainage, No significant erythema, Dressing is clean, dry, and intact DVT Evaluation: No evidence of DVT seen on physical exam. Labs: Lab Results  Component Value Date   WBC 18.8 (H) 12/10/2020   HGB 11.3 (L) 12/10/2020   HCT 33.5 (L) 12/10/2020   MCV 92.8 12/10/2020   PLT 232 12/10/2020   CMP Latest Ref Rng & Units 12/09/2020  Glucose 70 - 99 mg/dL 83  BUN 6 - 20 mg/dL 5(L)  Creatinine 0.44 - 1.00 mg/dL 0.61  Sodium 135 - 145 mmol/L 130(L)  Potassium 3.5 - 5.1 mmol/L 4.0  Chloride 98 - 111 mmol/L 99  CO2 22 - 32 mmol/L 19(L)  Calcium 8.9 - 10.3 mg/dL 8.8(L)  Total Protein 6.5 - 8.1 g/dL 5.8(L)  Total Bilirubin 0.3 - 1.2 mg/dL 0.6  Alkaline Phos 38 - 126 U/L  168(H)  AST 15 - 41 U/L 23  ALT 0 - 44 U/L 14   Edinburgh Score: Edinburgh Postnatal Depression Scale Screening Tool 12/10/2020  I have been able to laugh and see the funny side of things. 0  I have looked forward with enjoyment to things. 0  I have blamed myself unnecessarily when things went wrong. 0  I have been anxious or worried for no good reason. 0  I have felt scared or panicky for no good reason. 0  Things have been getting on top of me. 0  I have been so unhappy that I have had difficulty sleeping. 0  I have felt sad or miserable. 0  I have been so unhappy that I have been crying. 0  The thought of harming myself has occurred to me. 0  Edinburgh Postnatal Depression Scale Total 0      After visit meds:  Allergies as of 12/13/2020   No Known Allergies     Medication List    STOP taking these medications   promethazine 25 MG tablet Commonly known as: PHENERGAN     TAKE these medications   acetaminophen 500 MG tablet Commonly known as: TYLENOL Take 2 tablets (1,000 mg total) by mouth every 6 (six) hours as needed for headache.   Comfort Fit Maternity Supp Sm Misc 1 Units by Does not apply route daily as needed.   famotidine 20 MG tablet Commonly known as: PEPCID Take 20 mg by mouth 2 (two) times daily.   hydrOXYzine 25 MG tablet Commonly known as: ATARAX/VISTARIL Take 25 mg by mouth 3 (three) times daily as needed for itching.   loratadine 10 MG tablet Commonly known as: CLARITIN Take 10 mg by mouth daily as needed for allergies.   PRENATAL + DHA PO Take 1 tablet by mouth daily.        Discharge home in stable condition Infant Feeding: see lactation note Infant Disposition:NICU Discharge instruction: per After Visit Summary and Postpartum booklet. Activity: Advance as tolerated. Pelvic rest for 6 weeks.  Diet: routine diet Anticipated Birth Control: Unsure Postpartum Appointment:4 weeks Additional Postpartum F/U: Postpartum Depression  checkup Future Appointments:No future appointments. Follow up Visit:  Follow-up Information    Robyn Charles, MD. Schedule an appointment as soon as possible for a visit in 4 week(s).   Specialty: Obstetrics Contact information: [address] [place] [address] [phone]                   12/13/2020 Allyn Kenner, DO

## 2020-12-14 LAB — SURGICAL PATHOLOGY

## 2020-12-15 ENCOUNTER — Encounter (HOSPITAL_COMMUNITY): Payer: Self-pay | Admitting: Obstetrics

## 2020-12-17 ENCOUNTER — Ambulatory Visit: Payer: Self-pay

## 2020-12-17 NOTE — Lactation Note (Signed)
This note was copied from a baby's chart. Lactation Consultation Note  Patient Name: Robyn [name] EHMCN'O Date: 12/17/2020 Reason for consult: Follow-up assessment;Primapara;1st time breastfeeding;Multiple gestation;Infant < 6lbs;NICU baby;Late-preterm 34-36.6wks Age:26 days   LC in to assist Mom with positioning babies at the breast.  Both babies have started po feedings with bottles.  Baby boy A "Theo" had a desat after SLP worked with him yesterday, that self-resolved. Baby had been taking 8-10 ml by bottle.  Both babies positioned in cross cradle hold and showing subtle feeding cues.  Mom last pumped 1 hr prior.  Both babies made some attempt to latch to breast.  LC initiated a 20 mm nipple shield to assist with baby latching deeper on breast.  Babies both latched with the NS and gave a few sucks and then became sleepy and stayed latched without sucking for 5-10 mins.  When both babies came off the breast, milk noted in nipple shield.    GMOB help [name] "Theo" while Mom attempting to breastfeed baby B "Paxton".  Left Paxton STS on Mom's chest.   Encouraged Mom to continue her consistent pumping and offering breast at feeding times when babies show feeding cues.  Mom understands about normal LPT newborn feeding behavior.    Mom soaking pump parts in bin.  Talked about limiting soaking to 5 mins, per CDC guidelines.  Encouraged Mom to disassemble parts, wash, rinse and air dry in separate bin.   Mom desires lactation assistance again tomorrow.  Mom return demo'd applying the nipple shield.     Interventions Interventions: Breast feeding basics reviewed;Assisted with latch;Skin to skin;Breast massage;Hand express;Pre-pump if needed;Breast compression;Adjust position;Support pillows;Position options;Expressed milk;DEBP  Lactation Tools Discussed/Used Tools: Pump;Flanges;Nipple Shields Nipple shield size: 20 Flange Size: 27 Breast pump type: Double-Electric Breast Pump   Consult  Status Consult Status: Follow-up Date: 12/18/20 Follow-up type: In-patient    Judee Clara 12/17/2020, 2:44 PM

## 2020-12-18 ENCOUNTER — Ambulatory Visit: Payer: Self-pay

## 2020-12-18 NOTE — Lactation Note (Signed)
This note was copied from a baby's chart. Lactation Consultation Note  Patient Name: Robyn [name] ZOXWR'U Date: 12/18/2020 Reason for consult: NICU baby;Mother's request;Follow-up assessment Age:26 days  LC to room for bf assistance with baby B. Mom able to position and secure latch. Baby with a few suckles but mostly just "lick and learn." Reviewed normal feeding behaviors at 36 weeks. Mom with good milk supply and pumping regularly. Answered all questions. Will plan f/u visit.   Consult Status Consult Status: Follow-up Follow-up type: In-patient    Robyn [name] [facility] 12/18/2020, 2:40 PM

## 2020-12-22 ENCOUNTER — Ambulatory Visit: Payer: BC Managed Care – PPO

## 2020-12-22 ENCOUNTER — Ambulatory Visit: Payer: Self-pay

## 2020-12-22 NOTE — Lactation Note (Signed)
This note was copied from a baby's chart. Lactation Consultation Note  Patient Name: Robyn [name] IFOYD'X Date: 12/22/2020 Reason for consult: Follow-up assessment;NICU baby;Multiple gestation Age:25 years  LC to room for f/u visit. Mom continues to pump q 3 hours and feeds infants directly at breast 2 x each per day. LC answered questions about pumping volume. Mom desires weighted feedings for twins. LC to f/u with RN. Will plan return visit.    Consult Status Consult Status: Follow-up Follow-up type: In-patient   Elder Negus, MA IBCLC 12/22/2020, 3:49 PM

## 2020-12-23 ENCOUNTER — Ambulatory Visit: Payer: Self-pay

## 2020-12-23 NOTE — Lactation Note (Signed)
This note was copied from a baby's chart. Lactation Consultation Note  Patient Name: Robyn [name] EMLJQ'G Date: 12/23/2020 Reason for consult: Follow-up assessment;Other (Comment);Multiple gestation (ac/pc) Age:26 wk.o. LC to room for weighted breastfeeding for baby-B Luanna Salk). Infant was at the breast for 20 minutes with 11 minutes of nutritive suckling. Baby transferred from breast. LC relayed to RN.  Feeding Feeding Type: Breast Fed  LATCH Score Latch: Grasps breast easily, tongue down, lips flanged, rhythmical sucking.  Audible Swallowing: Spontaneous and intermittent  Type of Nipple: Everted at rest and after stimulation  Comfort (Breast/Nipple): Soft / non-tender  Hold (Positioning): No assistance needed to correctly position infant at breast.  LATCH Score: 10   Consult Status Consult Status: Follow-up Follow-up type: In-patient    Elder Negus, MA IBCLC 12/23/2020, 12:13 PM

## 2020-12-23 NOTE — Lactation Note (Signed)
This note was copied from [name] Robyn's chart. Lactation Consultation Note  Patient Name: Robyn [name] VKPQA'E Date: 12/23/2020 Reason for consult: Follow-up assessment;NICU Robyn Age:26 wk.o.  LC to room for weighted feed for Robyn [name] Danice Goltz). Robyn was on and off with several suckling bursts for 13 minutes. Minimal audible swallows. Total time at breast was about 20 minutes. Theo removed from the breast during this feeding. Reviewed feeding behaviors of late preterm infants and encouraged continued bf practice. Parents were provided with the opportunity to ask questions. All concerns were addressed.  Will plan follow up visit. Visit details were relayed to RN.   Consult Status Consult Status: Follow-up Follow-up type: In-patient   Elder Negus, MA IBCLC 12/23/2020, 6:01 PM

## 2020-12-29 ENCOUNTER — Ambulatory Visit: Payer: Self-pay

## 2020-12-29 NOTE — Lactation Note (Signed)
This note was copied from a baby's chart. Lactation Consultation Note  Patient Name: Robyn [name] DPTEL'M Date: 12/29/2020 Reason for consult: NICU baby;Follow-up assessment;Multiple gestation Age:26 wk.o. Brief f/u with mom who was pumping for twins. Per mom, both twins continue to practice bf and follow IDF algorithm. She continues to pump 60-143mLs about 8x day. Mom is aware of LC services. Discussed possible repeat of ac/pc for Theo as his latch improves.   Consult Status Consult Status: Follow-up Follow-up type: In-patient   Elder Negus, MA IBCLC 12/29/2020, 1:54 PM

## 2020-12-31 ENCOUNTER — Inpatient Hospital Stay (HOSPITAL_COMMUNITY): Admit: 2020-12-31 | Payer: BC Managed Care – PPO | Admitting: Obstetrics and Gynecology

## 2021-05-25 IMAGING — US US MFM OB DETAIL+14 WK
1 series · 15 of 28 positions shown · non-contrast
Comparison: none

[Series 1: us mfm ob detail+14 wk · 15 of 141 slices shown]
[im 1/141]
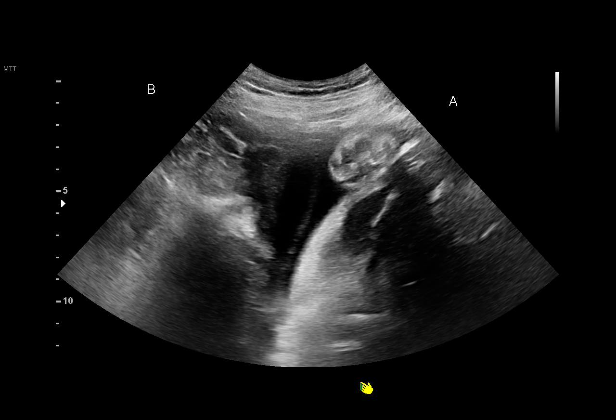
[im 11/141]
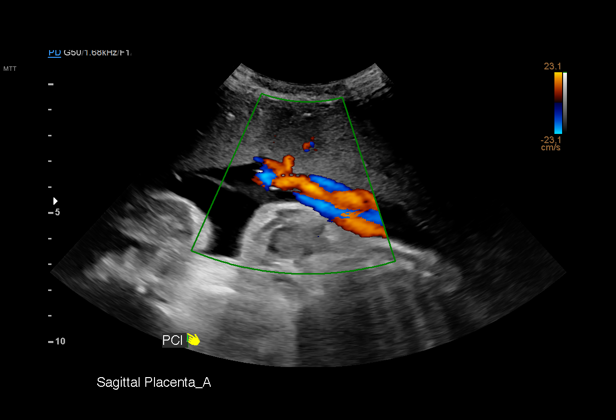
[im 21/141]
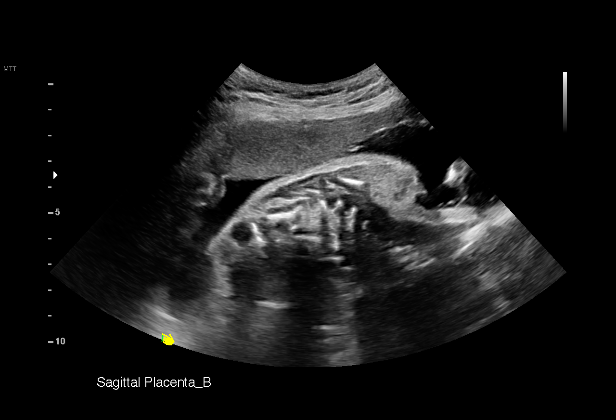
[im 32/141]
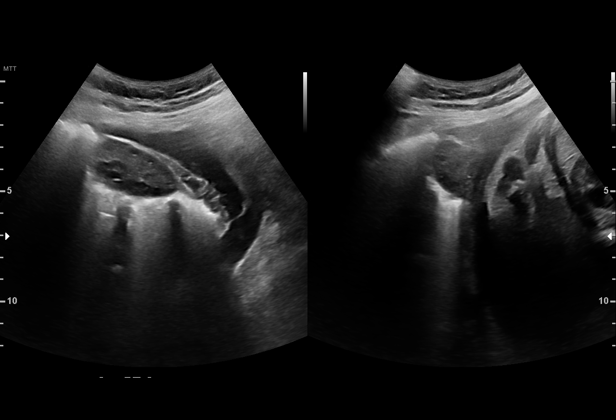
[im 42/141]
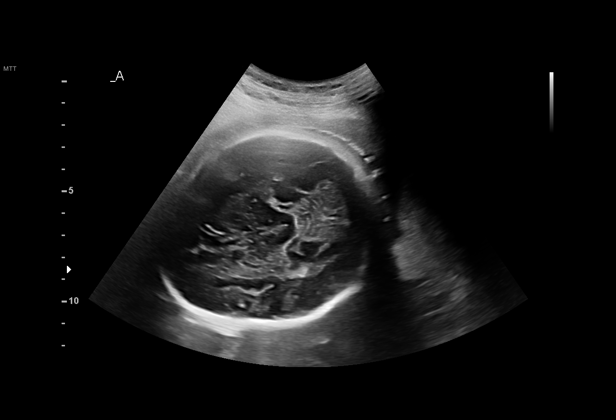
[im 52/141]
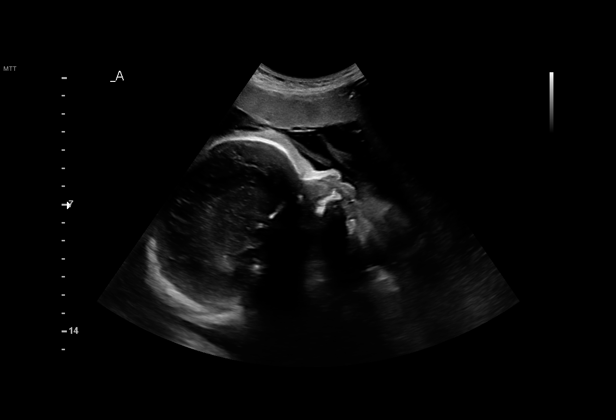
[im 63/141]
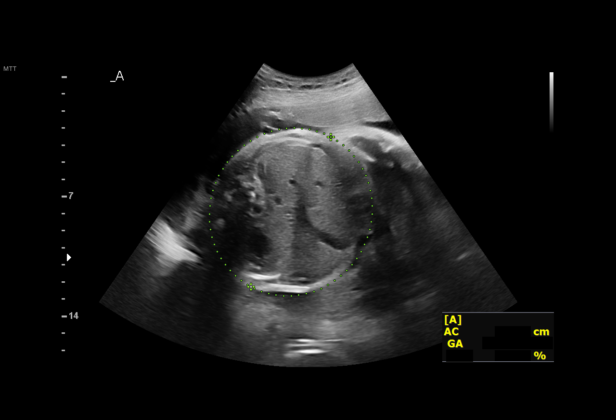
[im 73/141]
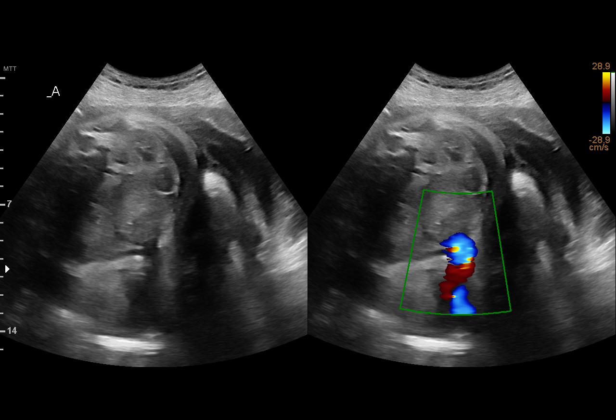
[im 78/141]
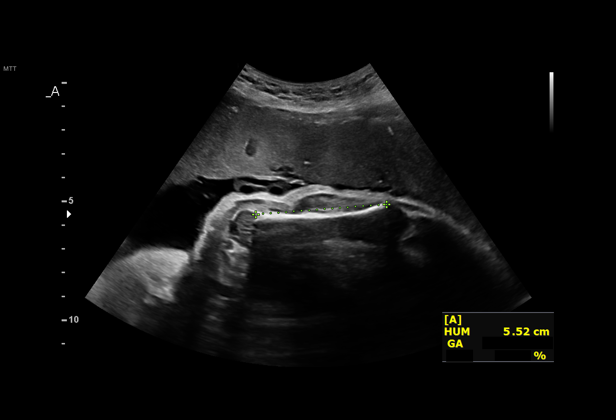
[im 89/141]
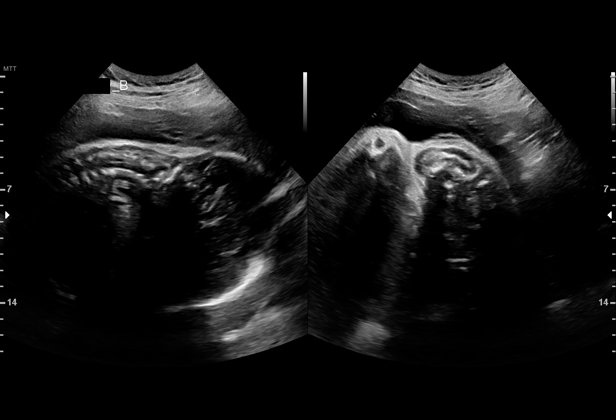
[im 99/141]
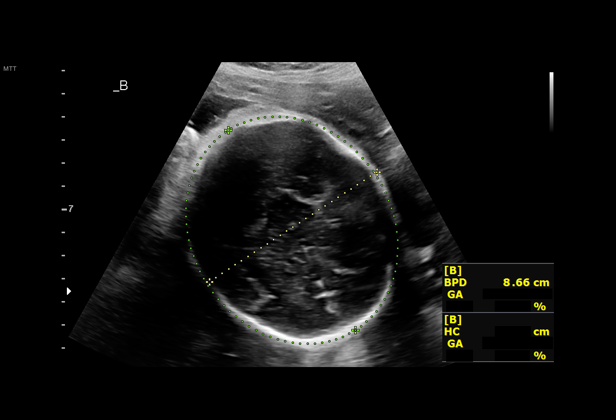
[im 109/141]
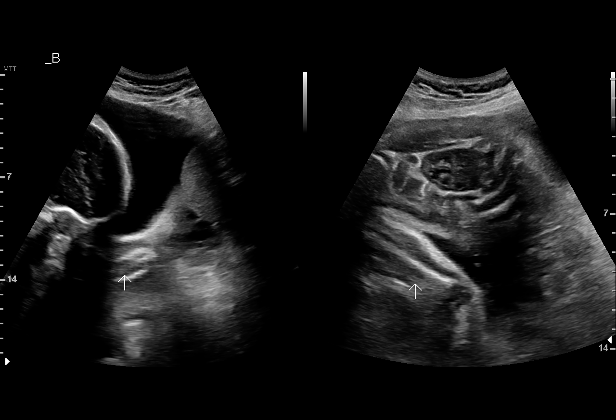
[im 120/141]
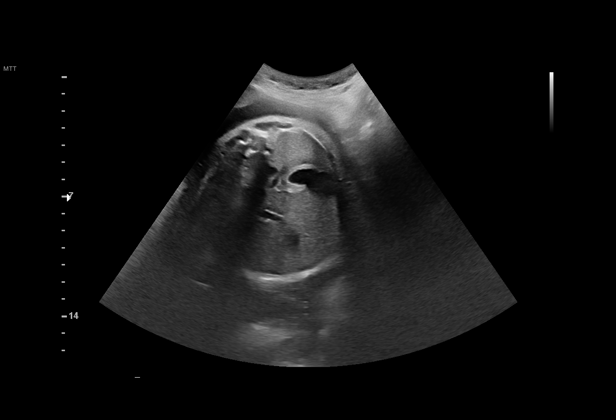
[im 130/141]
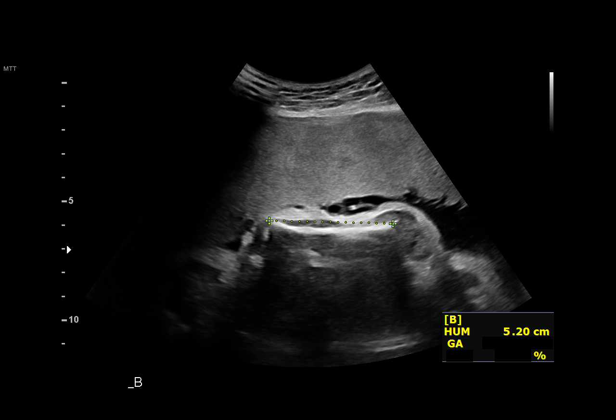
[im 141/141]
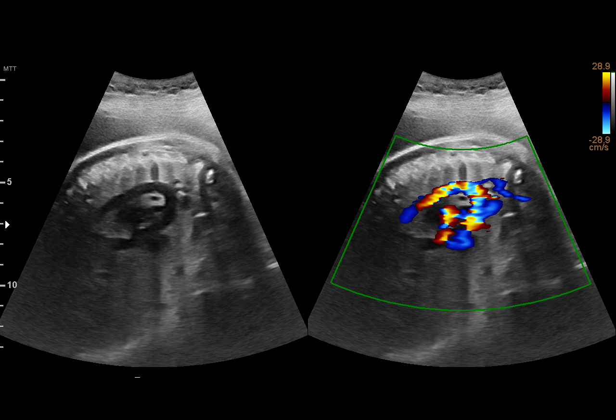

[15 of 28 positions shown; findings below may reference images not displayed]

OBGYN
                                                            [REDACTED]
                   [facility]

 2  US MFM OB DETAIL ADDL GEST            76811.02    PETAL GROCE
    +14 WK

Indications

 32 weeks gestation of pregnancy
 Twin pregnancy, di/di, third trimester
 Encounter for antenatal screening for
 malformations
 Pyelectasis of fetus on prenatal ultrasound
 (twin A)
Fetal Evaluation (Fetus A)

 Num Of Fetuses:         2
 Fetal Heart Rate(bpm):  140
 Cardiac Activity:       Observed
 Fetal Lie:              Lower Fetus Right
 Presentation:           Breech
 Placenta:               Anterior
 P. Cord Insertion:      Visualized, central

 Amniotic Fluid
 AFI FV:      Within normal limits

                             Largest Pocket(cm)

Biometry (Fetus A)

 BPD:      86.9  mm     G. Age:  35w 1d         95  %    CI:        81.32   %    70 - 86
                                                         FL/HC:      19.8   %    19.9 -
 HC:      304.2  mm     G. Age:  33w 6d         42  %    HC/AC:      1.00        0.96 -
 AC:      304.9  mm     G. Age:  34w 3d         91  %    FL/BPD:     69.3   %    71 - 87
 FL:       60.2  mm     G. Age:  31w 2d          9  %    FL/AC:      19.7   %    20 - 24
 HUM:      55.2  mm     G. Age:  32w 1d         44  %
 CER:      42.9  mm     G. Age:  33w 6d         56  %

 LV:        4.2  mm
 CM:        8.4  mm

 Est. FW:    5550  gm    4 lb 15 oz      68  %     FW Discordancy         1  %
OB History

 Gravidity:    1         Term:   0        Prem:   0        SAB:   0
 TOP:          0       Ectopic:  0        Living: 0
Gestational Age (Fetus A)

 LMP:           32w 5d        Date:  04/09/20                 EDD:   01/14/21
 U/S Today:     33w 5d                                        EDD:   01/07/21
 Best:          32w 5d     Det. By:  LMP  (04/09/20)          EDD:   01/14/21
Anatomy (Fetus A)

 Cranium:               Appears normal         LVOT:                   Not well visualized
 Cavum:                 Appears normal         Aortic Arch:            Appears normal
 Ventricles:            Appears normal         Ductal Arch:            Not well visualized
 Choroid Plexus:        Appears normal         Diaphragm:              Appears normal
 Cerebellum:            Appears normal         Stomach:                Appears normal, left
                                                                       sided
 Posterior Fossa:       Not well visualized    Abdomen:                Appears normal
 Nuchal Fold:           Not applicable (>20    Abdominal Wall:         Not well visualized
                        wks GA)
 Face:                  Appears normal         Cord Vessels:           Appears normal (3
                        (orbits and profile)                           vessel cord)
 Lips:                  Not well visualized    Kidneys:                Right UTD 7 mm
 Palate:                Not well visualized    Bladder:                Appears normal
 Thoracic:              Appears normal         Spine:                  Limited views
                                                                       appear normal
 Heart:                 Appears normal         Upper Extremities:      Visualized Limited
                        (4CH, axis, and
                        situs)
 RVOT:                  Not well visualized    Lower Extremities:      Visualized Limited

 Other:  Technically difficult due to advanced GA and fetal position.

Fetal Evaluation (Fetus B)

 Num Of Fetuses:         2
 Fetal Heart Rate(bpm):  121
 Cardiac Activity:       Observed
 Fetal Lie:              Upper Fetus Left
 Presentation:           Oblique, head to maternal Lt
 Placenta:               Anterior
 P. Cord Insertion:      Visualized, central
 Amniotic Fluid
 AFI FV:      Within normal limits

                             Largest Pocket(cm)

Biometry (Fetus B)

 BPD:      86.4  mm     G. Age:  34w 6d         93  %    CI:        82.54   %    70 - 86
                                                         FL/HC:      20.4   %    19.9 -
 HC:       300   mm     G. Age:  33w 2d         26  %    HC/AC:      0.98        0.96 -
 AC:      306.8  mm     G. Age:  34w 4d         93  %    FL/BPD:     70.7   %    71 - 87
 FL:       61.1  mm     G. Age:  31w 5d         15  %    FL/AC:      19.9   %    20 - 24
 HUM:      52.4  mm     G. Age:  30w 4d         13  %
 CER:      43.6  mm     G. Age:  34w 1d         66  %
 LV:        6.3  mm
 CM:          8  mm

 Est. FW:    0020  gm           5 lb     72  %     FW Discordancy      0 \ 1 %
Gestational Age (Fetus B)

 LMP:           32w 5d        Date:  04/09/20                 EDD:   01/14/21
 U/S Today:     33w 4d                                        EDD:   01/08/21
 Best:          32w 5d     Det. By:  LMP  (04/09/20)          EDD:   01/14/21
Anatomy (Fetus B)

 Cranium:               Appears normal         LVOT:                   Not well visualized
 Cavum:                 Not well visualized    Aortic Arch:            Appears normal
 Ventricles:            Appears normal         Ductal Arch:            Not well visualized
 Choroid Plexus:        Appears normal         Diaphragm:              Appears normal
 Cerebellum:            Appears normal         Stomach:                Appears normal, left
                                                                       sided
 Posterior Fossa:       Not well visualized    Abdomen:                Appears normal
 Nuchal Fold:           Not applicable (>20    Abdominal Wall:         Not well visualized
                        wks GA)
 Face:                  Not well visualized    Cord Vessels:           Appears normal (3
                                                                       vessel cord)
 Lips:                  Not well visualized    Kidneys:                Appear normal
 Palate:                Not well visualized    Bladder:                Appears normal
 Thoracic:              Appears normal         Spine:                  Limited views
                                                                       appear normal
 Heart:                 Appears normal         Upper Extremities:      Visualized Limited
                        (4CH, axis, and
                        situs)
 RVOT:                  Not well visualized    Lower Extremities:      Visualized Limited

 Other:  Fetus appears to be a male. Technically difficult due to advanced GA
         and fetal position.
Cervix Uterus Adnexa

 Cervix
 Not visualized (advanced GA >89wks)
 Uterus
 No abnormality visualized.

 Right Ovary
 Within normal limits.

 Left Ovary
 Within normal limits.

 Cul De Sac
 No free fluid seen.

 Adnexa
 No abnormality visualized.
Impression

 G1 P0.  Patient with dichorionic-diamniotic twin pregnancy is
 here for ultrasound evaluation.
 She conceived after Clomid treatment.  On cell free fetal DNA
 screening, the risks of fetal aneuploidies were not increased.
 She had fetal anatomy scan at your office that was reported
 as normal.

 Twin A: Lower fetus, maternal right, breech presentation,
 anterior placenta, male fetus.  Fetal growth is appropriate for
 gestational age.  Amniotic fluid is normal and good fetal
 activity seen.  Right mild urinary tract dilation (UTD) is seen.
 Both kidneys, otherwise, appear normal.  Fetal anatomical
 survey appears normal but limited by advanced gestational
 age.

 Twin B: Upper fetus, maternal left, transverse lie and head to
 maternal left, anterior placenta, male fetus.  Fetal growth is
 appropriate for gestational age.  Amniotic fluid is normal and
 good fetal activity seen.  Fetal anatomical survey appears
 normal but limited by advanced gestational age.

 Growth discordancy: 1% (normal).

 Patient does not have gestational diabetes.  Her blood
 pressure today at her office is 118/75 mmHg.

 I reassured the patient of the findings including mild urinary
 tract dilation seen in twin A that should resolve with
 advancing gestation or after birth.

 I briefly discussed the mode of delivery that is based on the
 presentations.
Recommendations

 -An appointment was made for her to return in 4 weeks for
 fetal growth assessment and BPP.
 -Delivery at 38 weeks gestation
                 Alberson, Ninestor

## 2021-06-21 ENCOUNTER — Ambulatory Visit (INDEPENDENT_AMBULATORY_CARE_PROVIDER_SITE_OTHER): Payer: BC Managed Care – PPO | Admitting: Family Medicine

## 2021-06-21 ENCOUNTER — Other Ambulatory Visit: Payer: Self-pay

## 2021-06-21 VITALS — BP 116/72 | HR 69 | Temp 97.8°F | Ht 65.0 in | Wt 140.4 lb

## 2021-06-21 DIAGNOSIS — M533 Sacrococcygeal disorders, not elsewhere classified: Secondary | ICD-10-CM | POA: Diagnosis not present

## 2021-06-21 NOTE — Patient Instructions (Signed)
Use Advil 200 mg twice a day for first 2 weeks of physical therapy.

## 2021-06-21 NOTE — Progress Notes (Signed)
[facility] [address] [place] [address] Dept: [phone] Dept Fax: [phone]  Office Visit  Subjective:    Patient ID: Robyn [name], female    DOB: June 07, 1996, 25 y.o..   MRN: [id number]  Chief Complaint  Patient presents with   Acute Visit    C/o having low back pain  months since having a baby in January.  No OTC meds taken.     History of Present Illness:  Patient is in today for evaluation of left lower back pain. Ms. Dorsainvil notes the pain began in the latter part of her pregnancy this past fall/winter. She had a twin pregnancy. The pain continued after her delivery. It was worse in the first two months, but has gradually decreased. However, the pain has never completely resolved. The pain is worse after she sits for a while and gets up or when carrying her infants on the left side. It is also exacerbated by jogging or exercise. The pain improves some with stretches, though she admits she is not sure about how to best stretch to help with this. She denies any sciatica, numbness/tinging int he left leg, or weakness.  Past Medical History: Patient Active Problem List   Diagnosis Date Noted   S/P C-section 12/11/2020   Preterm labor 12/10/2020   Dichorionic diamniotic twin pregnancy in third trimester 12/09/2020   Twin gestation in third trimester 12/09/2020   Asthma 10/15/2018   Past Surgical History:  Procedure Laterality Date   CESAREAN SECTION MULTI-GESTATIONAL N/A 12/09/2020   Procedure: CESAREAN SECTION MULTI-GESTATIONAL;  Surgeon: Marlow Baars, MD;  Location: [facility] ORS;  Service: Obstetrics;  Laterality: N/A;   WISDOM TOOTH EXTRACTION     WISDOM TOOTH EXTRACTION N/A 2012   Family History  Problem Relation Age of Onset   Asthma Mother    Cancer Maternal Grandmother        endometrial   Breast cancer Paternal Grandmother    Outpatient Medications Prior to Visit  Medication Sig Dispense Refill    acetaminophen (TYLENOL) 500 MG tablet Take 2 tablets (1,000 mg total) by mouth every 6 (six) hours as needed for headache.     Prenatal-FeFum-FA-DHA w/o A (PRENATAL + DHA PO) Take 1 tablet by mouth daily.     Elastic Bandages & Supports (COMFORT FIT MATERNITY SUPP SM) MISC 1 Units by Does not apply route daily as needed. 1 each 0   famotidine (PEPCID) 20 MG tablet Take 20 mg by mouth 2 (two) times daily.     hydrOXYzine (ATARAX/VISTARIL) 25 MG tablet Take 25 mg by mouth 3 (three) times daily as needed for itching.     loratadine (CLARITIN) 10 MG tablet Take 10 mg by mouth daily as needed for allergies. (Patient not taking: Reported on 06/21/2021)     No facility-administered medications prior to visit.   No Known Allergies    Objective:   Today's Vitals   06/21/21 1122  BP: 116/72  Pulse: 69  Temp: 97.8 F (36.6 C)  TempSrc: Temporal  SpO2: 99%  Weight: 140 lb 6.4 oz (63.7 kg)  Height: 5\' 5"  (1.651 m)   Body mass index is 23.36 kg/m.   General: Well developed, well nourished. No acute distress. Back: Straight. No pain on palpation over the spine. Pain indicated towards the left sacroiliac area. Extremities: Full ROM. No joint swelling or tenderness. Pain exacerbated with FABER of left hip and resisted   abduction/flexion. Strength normal. Psych: Alert and oriented x3. Normal  mood and affect.  Health Maintenance Due  Topic Date Due   HPV VACCINES (1 - 2-dose series) Never done   HIV Screening  Never done   Hepatitis C Screening  Never done   PAP-Cervical Cytology Screening  Never done   COVID-19 Vaccine (3 - Booster for Pfizer series) 08/23/2020     Assessment & Plan:   1. Pain of left sacroiliac joint I suspect she may have had a strain of her left sacroiliac joint due to carrying twins during her pregnancy, which has not resolved. I will refer her to PT for assessment and management to reduce pain and strengthen pelvic muscles. I recommend she take Advill 200 mg po twice a  day during the first two weeks of therapy. Follow-up if not improving.  - Ambulatory referral to Physical Therapy    Loyola Mast, MD

## 2021-06-28 ENCOUNTER — Ambulatory Visit: Payer: BC Managed Care – PPO | Attending: Family Medicine

## 2021-06-28 ENCOUNTER — Other Ambulatory Visit: Payer: Self-pay

## 2021-06-28 DIAGNOSIS — R29898 Other symptoms and signs involving the musculoskeletal system: Secondary | ICD-10-CM | POA: Diagnosis not present

## 2021-06-28 DIAGNOSIS — R293 Abnormal posture: Secondary | ICD-10-CM | POA: Insufficient documentation

## 2021-06-28 DIAGNOSIS — M6281 Muscle weakness (generalized): Secondary | ICD-10-CM | POA: Insufficient documentation

## 2021-06-28 DIAGNOSIS — M545 Low back pain, unspecified: Secondary | ICD-10-CM | POA: Diagnosis not present

## 2021-06-28 DIAGNOSIS — G8929 Other chronic pain: Secondary | ICD-10-CM | POA: Insufficient documentation

## 2021-06-28 NOTE — Patient Instructions (Signed)
Access Code: [email or web address] URL: [email or web address] Date: 06/28/2021 Prepared by: Gardiner Rhyme  Exercises Hooklying Transversus Abdominis Palpation - 1 x daily - 7 x weekly - 3 sets - 10 reps Prone Gluteal Sets - 1 x daily - 7 x weekly - 1 sets - 5 reps - 10 seconds hold

## 2021-06-28 NOTE — Therapy (Signed)
[facility] [address] [place], [place], [address] Phone: [phone]   Fax:  [phone]  Physical Therapy Evaluation  Patient Details  Name: Robyn [name] MRN: [id number] Date of Birth: 1996-01-09 Referring Provider (PT): Herbie Drape, MD   Encounter Date: 06/28/2021   PT End of Session - 06/28/21 1819     Visit Number 1    Number of Visits 8    Date for PT Re-Evaluation 08/23/21    Authorization Type BCBS    PT Start Time 1710   pt arrived late   PT Stop Time 1751    PT Time Calculation (min) 41 min    Activity Tolerance Patient tolerated treatment well    Behavior During Therapy [facility] for tasks assessed/performed             Past Medical History:  Diagnosis Date   Asthma     Past Surgical History:  Procedure Laterality Date   CESAREAN SECTION MULTI-GESTATIONAL N/A 12/09/2020   Procedure: CESAREAN SECTION MULTI-GESTATIONAL;  Surgeon: Marlow Baars, MD;  Location: [facility] LD ORS;  Service: Obstetrics;  Laterality: N/A;   WISDOM TOOTH EXTRACTION     WISDOM TOOTH EXTRACTION N/A 2012    There were no vitals filed for this visit.    Subjective Assessment - 06/28/21 1713     Subjective Pt reports bad back pain started while she was pregnant with her twins. 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 She gained 50# with surgery, and kind of wrote off her pain due to pregnancy. It continued after her c-section though, and it has improved but has plateaued. She has tried to do some stretching, but is not diligent. PT for knee pain in the past from soccer and a broken R collar bone, otherwise medical history is unremarkable. She is a Sport and exercise psychologist, so it requires more sitting. She tends to hold one baby at a time and favors the L side, even though her L side bothers her the most.    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 Yes    Pain Score 2    5-6/10 at worst   Pain Location Back    Pain Orientation Left;Lower    Pain Descriptors / Indicators Tightness    Pain Type Chronic pain    Pain Onset More than a month ago    Pain Frequency Intermittent    Aggravating Factors  jumping, running, quick movement, carrying son on L side    Pain Relieving Factors stretch, Advil if bad                [facility] PT Assessment - 06/28/21 0001       Assessment   Referring Provider (PT) Herbie Drape, MD    Onset Date/Surgical Date --   during pregnancy, last week   Hand Dominance Right    Prior Therapy for knee in high school      Precautions   Precautions None      Restrictions   Weight Bearing Restrictions No      Balance Screen   Has the patient fallen in the past 6 months No    Has the patient had a decrease in activity level because of a fear of falling?  No  Is the patient reluctant to leave their home because of a fear of falling?  No      Home Tourist information centre manager residence    Living Arrangements Spouse/significant other;Children      Prior Function   Level of Independence Independent    Vocation Full time employment    Vocation Requirements sitting    Leisure Playing soccer, most of the time with children      Functional Tests   Functional tests Squat;Lunges;Single leg stance      Squat   Comments B knee valgus, B foot ER (pain in L knee with correction to neutral), trunk collapse (less with OH squat)      Lunges   Comments B anterior knee translation, knee valgus      Single Leg Stance   Comments unremarkable, at least 10 seconds with eyes closed      Posture/Postural Control   Posture Comments mild forward shoulder rounding, mild R lateral lean      ROM / Strength   AROM / PROM / Strength AROM;Strength      AROM   Overall AROM Comments full and pain free, tightness felt in L low back  with right side bend    AROM Assessment Site Lumbar    Lumbar Flexion fingertips to floor    Lumbar Extension 30 deg    Lumbar - Right Side Bend fingertips past joint line ~2 inches    Lumbar - Left Side Bend fingertips past joint line ~2 inches    Lumbar - Right Rotation rotates past midline to R    Lumbar - Left Rotation rotates to midline to L      Strength   Overall Strength Comments TrA 2+/5, hip flexor and low back compensations L>R    Strength Assessment Site Hip    Right/Left Hip Right;Left    Right Hip Extension 3+/5    Right Hip External Rotation  3+/5    Right Hip ABduction 3+/5   some back pain   Left Hip Extension 3-/5    Left Hip External Rotation 3+/5    Left Hip ABduction 3+/5   sig hip flexor compensation and left low back pain     Palpation   Palpation comment TTP to L lower quadrant especially QL      Ambulation/Gait   Gait Comments unremarkable                        Objective measurements completed on examination: See above findings.               PT Education - 06/28/21 1820     Education Details Educated on hypermobility, need for strength with muscle weakness versus stretching, diagnosis, prognosis, POC, and HEP    Person(s) Educated Patient    Methods Explanation;Demonstration;Tactile cues;Verbal cues;Handout    Comprehension Verbalized understanding;Returned demonstration;Verbal cues required;Tactile cues required              PT Short Term Goals - 06/28/21 1759       PT SHORT TERM GOAL #1   Title Pt will be I and compliant with initial HEP to progress exercise.    Baseline provided at eval    Time 3    Period Weeks    Status New    Target Date 07/19/21      PT SHORT TERM GOAL #2   Title Pt will demonstrate symmetrical TrA firing for at least 5 seconds,  noted by palpation medial to B ASIS.    Baseline unable at eval    Time 3    Period Weeks    Status New    Target Date 07/19/21               PT  Long Term Goals - 06/28/21 1812       PT LONG TERM GOAL #1   Title Pt will demonstrate neutral pelvic alignment in supine, sidelying and prone, in order to fire inner core cylinder and hip stabilizers without hip flexor and low back compensation.    Baseline tendency toward posterior pelvic tilt, hip flexors and L QL overactive    Time 8    Period Weeks    Status New    Target Date 08/23/21      PT LONG TERM GOAL #2   Title Pt will demonstrate 4+/5 hip MMT without compensations.    Baseline see flowsheet    Time 8    Period Weeks    Status New    Target Date 08/23/21      PT LONG TERM GOAL #3   Title Pt will demo TrA firing for at least 30 seconds in sitting, supine, and standing while having a conversation, as needed for lumbar support with all activity.    Baseline unable to fire    Time 8    Period Weeks    Status New    Target Date 08/23/21      PT LONG TERM GOAL #4   Title Pt will demonstrate OH squat with proper form and no pain, as needed for lifting children.    Baseline Trunk collapse, B foot ER (L knee pain with foot neutral), knee valgus    Time 8    Period Weeks    Status New    Target Date 08/23/21      PT LONG TERM GOAL #5   Title Pt will be able to carry child without low back pain.    Baseline pain in left low back carrying on L side    Time 8    Period Weeks    Status New    Target Date 08/23/21      Additional Long Term Goals   Additional Long Term Goals Yes      PT LONG TERM GOAL #6   Title Pt will be able to return to recreational soccer and gym activity without pain.    Baseline not able to currently    Time 8    Period Weeks    Status New    Target Date 08/23/21                    Plan - 06/28/21 1752     Clinical Impression Statement Pt is a 25 yo female who presents to OP PT with L-sided LBP that worsened during last week of pregnancy with twins in Jan/Feb 2022. She had an emergency cesarian and has recovered well, denying  any bowel/bladder issues. She currently experiences pain with any sudden movements i.e. running or jumping and carrying her son on the right side. Pt is flexible and exhibits 4/6 spinal mobility (more hypermobile) with all lumbar AROM testing. She was unable to fire TrA initially with palpation and cueing, but did report some engagement with ball adduction, adding pelvic floor. Pt is very weak in hip abductors, external rotators and extensors compensating with L>R lower back and hip flexors. She was unable to perform prone  glutes sets without left lower back engagement. Pt was educated on diagnosis, prognosis, POC, and HEP verbalizing understanding and consent to tx. She would benefit from skilled PT 1x/week for 6-8 weeks to address impairments and restore pain free function.    Personal Factors and Comorbidities Time since onset of injury/illness/exacerbation;Past/Current Experience    Examination-Activity Limitations Carry;Lift    Examination-Participation Restrictions Community Activity    Stability/Clinical Decision Making Stable/Uncomplicated    Clinical Decision Making Low    Rehab Potential Good    PT Frequency 1x / week    PT Duration 8 weeks    PT Treatment/Interventions ADLs/Self Care Home Management;Electrical Stimulation;Moist Heat;Traction;Iontophoresis 4mg /ml Dexamethasone;Functional mobility training;Therapeutic activities;Therapeutic exercise;Balance training;Neuromuscular re-education;Manual techniques;Patient/family education;Passive range of motion;Dry needling;Joint Manipulations;Spinal Manipulations;Taping    PT Next Visit Plan FOTO, SIJ testing as indicated, Assess response to HEP/update PRN, add core stab/hip strength as pt tolerates, check for hip flexor/low back compensation and make sure she is in neutral pelvic alignment, manual therapy to address soft tissue restrictions    PT Home Exercise Plan    Consulted and Agree with Plan of Care Patient              Patient will benefit from skilled therapeutic intervention in order to improve the following deficits and impairments:  Increased fascial restricitons, Postural dysfunction, Improper body mechanics, Decreased strength, Impaired perceived functional ability, Decreased activity tolerance, Pain, Hypermobility  Visit Diagnosis: Chronic left-sided low back pain without sciatica  Muscle weakness (generalized)  Abnormal posture  Other symptoms and signs involving the musculoskeletal system     Problem List Patient Active Problem List   Diagnosis Date Noted   S/P C-section 12/11/2020   Preterm labor 12/10/2020   Dichorionic diamniotic twin pregnancy in third trimester 12/09/2020   Twin gestation in third trimester 12/09/2020   Asthma 10/15/2018    10/17/2018, PT, DPT 06/28/2021, 6:33 PM  [facility] [address] [place], [place], [place] Phone: [phone]   Fax:  [phone]  Name: Robyn [name] MRN: Jeannine Kitten Date of Birth: 09/16/1996

## 2021-07-07 ENCOUNTER — Encounter: Payer: Self-pay | Admitting: Physical Therapy

## 2021-07-07 ENCOUNTER — Ambulatory Visit: Payer: BC Managed Care – PPO | Admitting: Physical Therapy

## 2021-07-07 ENCOUNTER — Other Ambulatory Visit: Payer: Self-pay

## 2021-07-07 DIAGNOSIS — G8929 Other chronic pain: Secondary | ICD-10-CM

## 2021-07-07 DIAGNOSIS — M6281 Muscle weakness (generalized): Secondary | ICD-10-CM | POA: Diagnosis not present

## 2021-07-07 DIAGNOSIS — R293 Abnormal posture: Secondary | ICD-10-CM | POA: Diagnosis not present

## 2021-07-07 DIAGNOSIS — R29898 Other symptoms and signs involving the musculoskeletal system: Secondary | ICD-10-CM | POA: Diagnosis not present

## 2021-07-07 DIAGNOSIS — M545 Low back pain, unspecified: Secondary | ICD-10-CM | POA: Diagnosis not present

## 2021-07-07 NOTE — Therapy (Signed)
[facility] [address] [place], [place], [address] Phone: [phone]   Fax:  [phone]  Physical Therapy Treatment  Patient Details  Name: Robyn [name] MRN: [id number] Date of Birth: 11/23/1996 Referring Provider (PT): Herbie Drape, MD   Encounter Date: 07/07/2021   PT End of Session - 07/07/21 1642     Visit Number 2    Date for PT Re-Evaluation 08/23/21    Authorization Type BCBS    PT Start Time 1601    PT Stop Time 1651    PT Time Calculation (min) 50 min    Activity Tolerance Patient tolerated treatment well    Behavior During Therapy [facility] for tasks assessed/performed             Past Medical History:  Diagnosis Date   Asthma     Past Surgical History:  Procedure Laterality Date   CESAREAN SECTION MULTI-GESTATIONAL N/A 12/09/2020   Procedure: CESAREAN SECTION MULTI-GESTATIONAL;  Surgeon: Marlow Baars, MD;  Location: [facility] LD [facility]S;  Service: Obstetrics;  Laterality: N/A;   WISDOM TOOTH EXTRACTION     WISDOM TOOTH EXTRACTION N/A 2012    There were no vitals filed for this visit.   Subjective Assessment - 07/07/21 1601     Subjective Still having some back pain mostly when she is active.    Currently in Pain? No/denies                               [facility] Adult PT Treatment/Exercise - 07/07/21 0001       Exercises   Exercises Lumbar      Lumbar Exercises: Stretches   Passive Hamstring Stretch Left;Right;3 reps;10 seconds    Single Knee to Chest Stretch Left;Right;2 reps;10 seconds    Double Knee to Chest Stretch 2 reps;10 seconds    Lower Trunk Rotation 3 reps;10 seconds   Tightness on L side w/ R rotation     Lumbar Exercises: Aerobic   Nustep L5 x 6 min      Lumbar Exercises: Standing   Row Strengthening;Power tower;Both;20 reps    Row Limitations 15    Shoulder Extension Strengthening;Both;20 reps    Shoulder Extension Limitations 10    Other Standing Lumbar Exercises  Hip Ext 5lb 2x10      Lumbar Exercises: Seated   Sit to Stand 10 reps   10 w/ chest press, 10 w/ OHP     Lumbar Exercises: Supine   Ab Set 10 reps;2 seconds    Bridge Compliant;10 reps;2 seconds    Other Supine Lumbar Exercises Ab set w/ march x10    Other Supine Lumbar Exercises LE on Pball bridges, K2C, Oblq x 10                      PT Short Term Goals - 06/28/21 1759       PT SHORT TERM GOAL #1   Title Pt will be I and compliant with initial HEP to progress exercise.    Baseline provided at eval    Time 3    Period Weeks    Status New    Target Date 07/19/21      PT SHORT TERM GOAL #2   Title Pt will demonstrate symmetrical TrA firing for at least 5 seconds, noted by palpation medial to B ASIS.    Baseline unable at eval    Time 3  Period Weeks    Status New    Target Date 07/19/21               PT Long Term Goals - 06/28/21 1812       PT LONG TERM GOAL #1   Title Pt will demonstrate neutral pelvic alignment in supine, sidelying and prone, in order to fire inner core cylinder and hip stabilizers without hip flexor and low back compensation.    Baseline tendency toward posterior pelvic tilt, hip flexors and L QL overactive    Time 8    Period Weeks    Status New    Target Date 08/23/21      PT LONG TERM GOAL #2   Title Pt will demonstrate 4+/5 hip MMT without compensations.    Baseline see flowsheet    Time 8    Period Weeks    Status New    Target Date 08/23/21      PT LONG TERM GOAL #3   Title Pt will demo TrA firing for at least 30 seconds in sitting, supine, and standing while having a conversation, as needed for lumbar support with all activity.    Baseline unable to fire    Time 8    Period Weeks    Status New    Target Date 08/23/21      PT LONG TERM GOAL #4   Title Pt will demonstrate OH squat with proper form and no pain, as needed for lifting children.    Baseline Trunk collapse, B foot ER (L knee pain with foot neutral),  knee valgus    Time 8    Period Weeks    Status New    Target Date 08/23/21      PT LONG TERM GOAL #5   Title Pt will be able to carry child without low back pain.    Baseline pain in left low back carrying on L side    Time 8    Period Weeks    Status New    Target Date 08/23/21      Additional Long Term Goals   Additional Long Term Goals Yes      PT LONG TERM GOAL #6   Title Pt will be able to return to recreational soccer and gym activity without pain.    Baseline not able to currently    Time 8    Period Weeks    Status New    Target Date 08/23/21                   Plan - 07/07/21 1637     Clinical Impression Statement Pt tolerated an initial progression to TE well. Cues given throughout session for core engagement. She stated some difficulty with obliques with LE on Pball. L low back tightness reported with R lumbar rotation. Some weakness noted with standing hip extensions. Good LE flexibility with passive stretching.    Personal Factors and Comorbidities Time since onset of injury/illness/exacerbation;Past/Current Experience    Examination-Participation Restrictions Community Activity    Stability/Clinical Decision Making Stable/Uncomplicated    Rehab Potential Good    PT Frequency 1x / week    PT Duration 8 weeks    PT Treatment/Interventions ADLs/Self Care Home Management;Electrical Stimulation;Moist Heat;Traction;Iontophoresis 4mg /ml Dexamethasone;Functional mobility training;Therapeutic activities;Therapeutic exercise;Balance training;Neuromuscular re-education;Manual techniques;Patient/family education;Passive range of motion;Dry needling;Joint Manipulations;Spinal Manipulations;Taping    PT Next Visit Plan add core stab/hip strength as pt tolerates, check for hip flexor/low back compensation and make sure  she is in neutral pelvic alignment, manual therapy to address soft tissue restrictions             Patient will benefit from skilled therapeutic  intervention in order to improve the following deficits and impairments:  Increased fascial restricitons, Postural dysfunction, Improper body mechanics, Decreased strength, Impaired perceived functional ability, Decreased activity tolerance, Pain, Hypermobility  Visit Diagnosis: Chronic left-sided low back pain without sciatica  Abnormal posture  Muscle weakness (generalized)     Problem List Patient Active Problem List   Diagnosis Date Noted   S/P C-section 12/11/2020   Preterm labor 12/10/2020   Dichorionic diamniotic twin pregnancy in third trimester 12/09/2020   Twin gestation in third trimester 12/09/2020   Asthma 10/15/2018    Grayce Sessions 07/07/2021, 4:43 PM  [facility] [address] [place], [place], [address] Phone: [phone]   Fax:  [phone]  Name: Izzabelle Bouley MRN: [id number] Date of Birth: 13-Oct-1996

## 2021-07-13 ENCOUNTER — Ambulatory Visit: Payer: BC Managed Care – PPO | Admitting: Physical Therapy

## 2021-07-13 ENCOUNTER — Other Ambulatory Visit: Payer: Self-pay

## 2021-07-13 ENCOUNTER — Encounter: Payer: Self-pay | Admitting: Physical Therapy

## 2021-07-13 DIAGNOSIS — R293 Abnormal posture: Secondary | ICD-10-CM

## 2021-07-13 DIAGNOSIS — R29898 Other symptoms and signs involving the musculoskeletal system: Secondary | ICD-10-CM | POA: Diagnosis not present

## 2021-07-13 DIAGNOSIS — M6281 Muscle weakness (generalized): Secondary | ICD-10-CM

## 2021-07-13 DIAGNOSIS — G8929 Other chronic pain: Secondary | ICD-10-CM | POA: Diagnosis not present

## 2021-07-13 DIAGNOSIS — M545 Low back pain, unspecified: Secondary | ICD-10-CM | POA: Diagnosis not present

## 2021-07-13 NOTE — Therapy (Signed)
[facility] [address] [place], [place], [address] Phone: [phone]   Fax:  [phone]  Physical Therapy Treatment  Patient Details  Name: Robyn [name] MRN: [id number] Date of Birth: [birth year] Referring Provider (PT): Herbie Drape, MD   Encounter Date: 07/13/2021   PT End of Session - 07/13/21 1745     Visit Number 3    Number of Visits 8    Date for PT Re-Evaluation 08/23/21    Authorization Type BCBS    PT Start Time 1659    PT Stop Time 1743    PT Time Calculation (min) 44 min    Activity Tolerance Patient tolerated treatment well    Behavior During Therapy [facility] for tasks assessed/performed             Past Medical History:  Diagnosis Date   Asthma     Past Surgical History:  Procedure Laterality Date   CESAREAN SECTION MULTI-GESTATIONAL N/A 12/09/2020   Procedure: CESAREAN SECTION MULTI-GESTATIONAL;  Surgeon: Marlow Baars, MD;  Location: [facility] LD ORS;  Service: Obstetrics;  Laterality: N/A;   WISDOM TOOTH EXTRACTION     WISDOM TOOTH EXTRACTION N/A 2012    There were no vitals filed for this visit.   Subjective Assessment - 07/13/21 1656     Subjective Pain hasn't been bothering her too much but it is because she hasn't done anything that flares it up recently. Requesting additional HEP.    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 No/denies                [facility] PT Assessment - 07/13/21 1708       Observation/Other Assessments   Focus on Therapeutic Outcomes (FOTO)  Hip: 71      Palpation   Palpation comment soft tissue restriction in lower thoracic/upper lumbar paraspinals, lateral glutes, piriformis                           [facility] Adult PT Treatment/Exercise - 07/13/21 0001       Lumbar Exercises: Aerobic   Recumbent Bike L2 x 6 min      Lumbar Exercises: Standing   Other Standing Lumbar Exercises  sidestepping with red TB around ankles 2x47ft   R lateral trunk lean     Lumbar Exercises: Supine   Ab Set 5 reps    AB Set Limitations review on palpation of TrA and isolating it without contraction of rectus and obliques    Pelvic Tilt 10 reps    Bridge with clamshell 10 reps   red TB above knees   Single Leg Bridge 10 reps    Bridge with [employer] Limitations L>R hip instability    Other Supine Lumbar Exercises overhead medball lift with yellow medball 10x    Other Supine Lumbar Exercises bent knee fallout + TrA 5x each   hip instability evident     Lumbar Exercises: Prone   Other Prone Lumbar Exercises plank on elbows/toes + perturbations 2x30", plank + alt hip ex x10    Other Prone Lumbar Exercises R donkey kick 10x, L hip extension knee extended 10x   heavy L>R lumbar paraspinals compensation                   PT Education - 07/13/21 1745     Education Details update to HEP  Person(s) Educated Patient    Methods Explanation;Demonstration;Tactile cues;Verbal cues;Handout    Comprehension Verbalized understanding;Returned demonstration              PT Short Term Goals - 07/13/21 1754       PT SHORT TERM GOAL #1   Title Pt will be I and compliant with initial HEP to progress exercise.    Baseline provided at eval    Time 3    Period Weeks    Status Achieved    Target Date 07/19/21      PT SHORT TERM GOAL #2   Title Pt will demonstrate symmetrical TrA firing for at least 5 seconds, noted by palpation medial to B ASIS.    Baseline unable at eval    Time 3    Period Weeks    Status On-going    Target Date 07/19/21               PT Long Term Goals - 07/13/21 1754       PT LONG TERM GOAL #1   Title Pt will demonstrate neutral pelvic alignment in supine, sidelying and prone, in order to fire inner core cylinder and hip stabilizers without hip flexor and low back compensation.    Baseline tendency toward posterior pelvic tilt, hip flexors and L  QL overactive    Time 8    Period Weeks    Status On-going      PT LONG TERM GOAL #2   Title Pt will demonstrate 4+/5 hip MMT without compensations.    Baseline see flowsheet    Time 8    Period Weeks    Status On-going      PT LONG TERM GOAL #3   Title Pt will demo TrA firing for at least 30 seconds in sitting, supine, and standing while having a conversation, as needed for lumbar support with all activity.    Baseline unable to fire    Time 8    Period Weeks    Status On-going      PT LONG TERM GOAL #4   Title Pt will demonstrate OH squat with proper form and no pain, as needed for lifting children.    Baseline Trunk collapse, B foot ER (L knee pain with foot neutral), knee valgus    Time 8    Period Weeks    Status On-going      PT LONG TERM GOAL #5   Title Pt will be able to carry child without low back pain.    Baseline pain in left low back carrying on L side    Time 8    Period Weeks    Status On-going      PT LONG TERM GOAL #6   Title Pt will be able to return to recreational soccer and gym activity without pain.    Baseline not able to currently    Time 8    Period Weeks    Status On-going                   Plan - 07/13/21 1746     Clinical Impression Statement Patient arrived to session without complaints. Requesting additional HEP exercises today. Reviewed self-palpation of TrA and worked on muscle isolation without contraction of rectus and obliques. Able to progress TrA isolation exercises today slightly. Worked on glute strengthening with patient demonstrating definite L glute max and core weakness in single leg bridges. Also demonstrating heavy compensation in lumbar paraspinals L>R  with hip extension in prone. Plank form looked good; provided additional challenges to engage core. Patient tolerated session without c/o pain throughout. Updated and reviewed new HEP- patient reported understanding and without complaints at end of session.    Personal  Factors and Comorbidities Time since onset of injury/illness/exacerbation;Past/Current Experience    Examination-Participation Restrictions Community Activity    Stability/Clinical Decision Making Stable/Uncomplicated    Rehab Potential Good    PT Frequency 1x / week    PT Duration 8 weeks    PT Treatment/Interventions ADLs/Self Care Home Management;Electrical Stimulation;Moist Heat;Traction;Iontophoresis 4mg /ml Dexamethasone;Functional mobility training;Therapeutic activities;Therapeutic exercise;Balance training;Neuromuscular re-education;Manual techniques;Patient/family education;Passive range of motion;Dry needling;Joint Manipulations;Spinal Manipulations;Taping    PT Next Visit Plan add core stab/hip strength as pt tolerates, check for hip flexor/low back compensation and make sure she is in neutral pelvic alignment, manual therapy to address soft tissue restrictions             Patient will benefit from skilled therapeutic intervention in order to improve the following deficits and impairments:  Increased fascial restricitons, Postural dysfunction, Improper body mechanics, Decreased strength, Impaired perceived functional ability, Decreased activity tolerance, Pain, Hypermobility  Visit Diagnosis: Chronic left-sided low back pain without sciatica  Abnormal posture  Muscle weakness (generalized)  Other symptoms and signs involving the musculoskeletal system     Problem List Patient Active Problem List   Diagnosis Date Noted   S/P C-section 12/11/2020   Preterm labor 12/10/2020   Dichorionic diamniotic twin pregnancy in third trimester 12/09/2020   Twin gestation in third trimester 12/09/2020   Asthma 10/15/2018     10/17/2018, PT, DPT 07/13/21 5:55 PM    [facility] [address] [place], [place], [place] Phone: [phone]   Fax:  [phone]  Name: Robyn [name] MRN: Jeannine Kitten Date of Birth:  05/22/1996

## 2021-07-13 NOTE — Patient Instructions (Signed)
Access Code: [email or web address] URL: [email or web address] Date: 07/13/2021 Prepared by: Georgina Peer  Exercises Single Leg Bridge - 2 x daily - 7 x weekly - 2 sets - 10 reps Hooklying Single Leg Bent Knee Fallouts with Resistance - 2 x daily - 7 x weekly - 2 sets - 10 reps Supine 90/90 Alternating Toe Touch - 2 x daily - 7 x weekly - 2 sets - 10 reps Side Stepping with Resistance at Ankles - 2 x daily - 7 x weekly - 2 sets - 10 reps Plank with Hip Extension - 2 x daily - 7 x weekly - 2 sets - 10 reps

## 2021-07-18 ENCOUNTER — Encounter: Payer: Self-pay | Admitting: Physical Therapy

## 2021-07-18 ENCOUNTER — Other Ambulatory Visit: Payer: Self-pay

## 2021-07-18 ENCOUNTER — Ambulatory Visit: Payer: BC Managed Care – PPO | Admitting: Physical Therapy

## 2021-07-18 DIAGNOSIS — R29898 Other symptoms and signs involving the musculoskeletal system: Secondary | ICD-10-CM

## 2021-07-18 DIAGNOSIS — G8929 Other chronic pain: Secondary | ICD-10-CM

## 2021-07-18 DIAGNOSIS — M545 Low back pain, unspecified: Secondary | ICD-10-CM | POA: Diagnosis not present

## 2021-07-18 DIAGNOSIS — M6281 Muscle weakness (generalized): Secondary | ICD-10-CM | POA: Diagnosis not present

## 2021-07-18 DIAGNOSIS — R293 Abnormal posture: Secondary | ICD-10-CM

## 2021-07-18 NOTE — Therapy (Signed)
[facility] [address] [place], [place], [address] Phone: [phone]   Fax:  [phone]  Physical Therapy Treatment  Patient Details  Name: Robyn [name] MRN: [id number] Date of Birth: 21-Sep-1996 Referring Provider (PT): Herbie Drape, MD   Encounter Date: 07/18/2021   PT End of Session - 07/18/21 1745     Visit Number 4    Number of Visits 8    Date for PT Re-Evaluation 08/23/21    Authorization Type BCBS    PT Start Time 1704    PT Stop Time 1742    PT Time Calculation (min) 38 min    Activity Tolerance Patient tolerated treatment well    Behavior During Therapy [facility] for tasks assessed/performed             Past Medical History:  Diagnosis Date   Asthma     Past Surgical History:  Procedure Laterality Date   CESAREAN SECTION MULTI-GESTATIONAL N/A 12/09/2020   Procedure: CESAREAN SECTION MULTI-GESTATIONAL;  Surgeon: Marlow Baars, MD;  Location: [facility] LD ORS;  Service: Obstetrics;  Laterality: N/A;   WISDOM TOOTH EXTRACTION     WISDOM TOOTH EXTRACTION N/A 2012    There were no vitals filed for this visit.   Subjective Assessment - 07/18/21 1703     Subjective New HEP is tough, but going well. Had some pain the other day on a walk when she was pushing her double stroller up some pretty steep hills.    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 No/denies                               [facility] Adult PT Treatment/Exercise - 07/18/21 0001       Exercises   Exercises Knee/Hip      Lumbar Exercises: Aerobic   Recumbent Bike L3 x 6 min      Lumbar Exercises: Standing   Wall Slides 10 reps    Wall Slides Limitations red TB above knees    Other Standing Lumbar Exercises hip hinge with hockey stick 10x, deadlift with 7# each 2x10- 2nd set at the wall   cues to maintain neutral spine   Other Standing Lumbar Exercises squat to mat 10x       Lumbar Exercises: Supine   Ab Set 10 reps    AB Set Limitations review on palpation of TrA and isolating it without contraction of rectus and obliques    Single Leg Bridge 10 reps   each side   Bridge with [employer] Limitations improved instability on L    Other Supine Lumbar Exercises bent knee fallout + TrA 2x10 each      Knee/Hip Exercises: Standing   Other Standing Knee Exercises R/L sidestepping, ant/pos monster walks with red TB 4x 82ft   cues to avoid lateral and anterior trunk lean                   PT Education - 07/18/21 1744     Education Details edu on anatomy of TrA, it's importance and location; encouraged patient to practice TrA contractions in different positions throughout the day to improve carryover to daily activities    Person(s) Educated Patient    Methods Explanation;Demonstration;Tactile cues;Verbal cues    Comprehension Verbalized understanding  PT Short Term Goals - 07/13/21 1754       PT SHORT TERM GOAL #1   Title Pt will be I and compliant with initial HEP to progress exercise.    Baseline provided at eval    Time 3    Period Weeks    Status Achieved    Target Date 07/19/21      PT SHORT TERM GOAL #2   Title Pt will demonstrate symmetrical TrA firing for at least 5 seconds, noted by palpation medial to B ASIS.    Baseline unable at eval    Time 3    Period Weeks    Status On-going    Target Date 07/19/21               PT Long Term Goals - 07/13/21 1754       PT LONG TERM GOAL #1   Title Pt will demonstrate neutral pelvic alignment in supine, sidelying and prone, in order to fire inner core cylinder and hip stabilizers without hip flexor and low back compensation.    Baseline tendency toward posterior pelvic tilt, hip flexors and L QL overactive    Time 8    Period Weeks    Status On-going      PT LONG TERM GOAL #2   Title Pt will demonstrate 4+/5 hip MMT without compensations.    Baseline see  flowsheet    Time 8    Period Weeks    Status On-going      PT LONG TERM GOAL #3   Title Pt will demo TrA firing for at least 30 seconds in sitting, supine, and standing while having a conversation, as needed for lumbar support with all activity.    Baseline unable to fire    Time 8    Period Weeks    Status On-going      PT LONG TERM GOAL #4   Title Pt will demonstrate OH squat with proper form and no pain, as needed for lifting children.    Baseline Trunk collapse, B foot ER (L knee pain with foot neutral), knee valgus    Time 8    Period Weeks    Status On-going      PT LONG TERM GOAL #5   Title Pt will be able to carry child without low back pain.    Baseline pain in left low back carrying on L side    Time 8    Period Weeks    Status On-going      PT LONG TERM GOAL #6   Title Pt will be able to return to recreational soccer and gym activity without pain.    Baseline not able to currently    Time 8    Period Weeks    Status On-going                   Plan - 07/18/21 1745     Clinical Impression Statement Patient reported some pain the other day when pushing her double stroller uphill on a walk. Educated patient on maintaining slight engagement of her core with pushing/pulling activities. Also reviewed TrA contraction isolation and educated patient on the pertinent anatomy. Initiated hip hinges and worked up to light dead lifts today with cueing for neutral spine and posterior weight shift. Patient demonstrated much improved hip stability with single leg bridges; still demonstrating compensatory trunk lean with banded sidestepping. Patient tolerated duration of session well and without complaints at end  of session.    Personal Factors and Comorbidities Time since onset of injury/illness/exacerbation;Past/Current Experience    Examination-Participation Restrictions Community Activity    Stability/Clinical Decision Making Stable/Uncomplicated    Rehab Potential  Good    PT Frequency 1x / week    PT Duration 8 weeks    PT Treatment/Interventions ADLs/Self Care Home Management;Electrical Stimulation;Moist Heat;Traction;Iontophoresis 4mg /ml Dexamethasone;Functional mobility training;Therapeutic activities;Therapeutic exercise;Balance training;Neuromuscular re-education;Manual techniques;Patient/family education;Passive range of motion;Dry needling;Joint Manipulations;Spinal Manipulations;Taping    PT Next Visit Plan add core stab/hip strength as pt tolerates, check for hip flexor/low back compensation and make sure she is in neutral pelvic alignment, manual therapy to address soft tissue restrictions             Patient will benefit from skilled therapeutic intervention in order to improve the following deficits and impairments:  Increased fascial restricitons, Postural dysfunction, Improper body mechanics, Decreased strength, Impaired perceived functional ability, Decreased activity tolerance, Pain, Hypermobility  Visit Diagnosis: Chronic left-sided low back pain without sciatica  Abnormal posture  Muscle weakness (generalized)  Other symptoms and signs involving the musculoskeletal system     Problem List Patient Active Problem List   Diagnosis Date Noted   S/P C-section 12/11/2020   Preterm labor 12/10/2020   Dichorionic diamniotic twin pregnancy in third trimester 12/09/2020   Twin gestation in third trimester 12/09/2020   Asthma 10/15/2018     10/17/2018, PT, DPT 07/18/21 5:49 PM    [facility] [address] [place], [place], [place] Phone: [phone]   Fax:  [phone]  Name: Robyn [name] MRN: Jeannine Kitten Date of Birth: March 03, 1996

## 2021-07-21 ENCOUNTER — Encounter: Payer: Self-pay | Admitting: Family Medicine

## 2021-07-21 ENCOUNTER — Other Ambulatory Visit: Payer: Self-pay

## 2021-07-21 ENCOUNTER — Ambulatory Visit (INDEPENDENT_AMBULATORY_CARE_PROVIDER_SITE_OTHER): Payer: BC Managed Care – PPO | Admitting: Family Medicine

## 2021-07-21 VITALS — BP 106/80 | HR 70 | Temp 97.9°F | Ht 65.0 in | Wt 143.6 lb

## 2021-07-21 DIAGNOSIS — L03011 Cellulitis of right finger: Secondary | ICD-10-CM | POA: Diagnosis not present

## 2021-07-21 DIAGNOSIS — Z8759 Personal history of other complications of pregnancy, childbirth and the puerperium: Secondary | ICD-10-CM | POA: Insufficient documentation

## 2021-07-21 MED ORDER — CEPHALEXIN 500 MG PO CAPS: 500.0000 mg | ORAL_CAPSULE | Freq: Two times a day (BID) | ORAL | 0 refills | Status: DC

## 2021-07-21 NOTE — Progress Notes (Signed)
  [facility] [address] [place] [address] Dept: [phone] Dept Fax: [phone]  Office Visit  Subjective:    Patient ID: Robyn [name], female    DOB: 1996/01/30, 25 y.o..   MRN: [id number]  Chief Complaint  Patient presents with   Follow-up    Pt c/o swollen and red cuticles on right hand.    History of Present Illness:  Patient is in today for evaluation of swelling, redness and pain around her cuticles of the right hand, esp. the 4th finger. she notes that earlier this morning, this was quite swollen. Ms. Sparlin has had ongoing issues with the cuticles and we had previously discussed decreasing her hand washing. She has been also using lotions to try and help. She has seen her left hand improve.  Past Medical History: Patient Active Problem List   Diagnosis Date Noted   History of twin pregnancy in prior pregnancy 07/21/2021   S/P C-section 12/11/2020   Asthma 10/15/2018   Past Surgical History:  Procedure Laterality Date   CESAREAN SECTION MULTI-GESTATIONAL N/A 12/09/2020   Procedure: CESAREAN SECTION MULTI-GESTATIONAL;  Surgeon: Marlow Baars, MD;  Location: [facility] LD [facility]S;  Service: Obstetrics;  Laterality: N/A;   WISDOM TOOTH EXTRACTION     WISDOM TOOTH EXTRACTION N/A 2012   Family History  Problem Relation Age of Onset   Asthma Mother    Cancer Maternal Grandmother        endometrial   Breast cancer Paternal Grandmother    Outpatient Medications Prior to Visit  Medication Sig Dispense Refill   acetaminophen (TYLENOL) 500 MG tablet Take 2 tablets (1,000 mg total) by mouth every 6 (six) hours as needed for headache.     Prenatal-FeFum-FA-DHA w/o A (PRENATAL + DHA PO) Take 1 tablet by mouth daily.     Ibuprofen (ADVIL) 200 MG CAPS Advil     No facility-administered medications prior to visit.   No Known Allergies    Objective:   Today's Vitals   07/21/21 1033  BP: 106/80  Pulse: 70  Temp: 97.9 F  (36.6 C)  TempSrc: Temporal  SpO2: 98%  Weight: 143 lb 9.6 oz (65.1 kg)  Height: 5\' 5"  (1.651 m)   Body mass index is 23.9 kg/m.   General: Well developed, well nourished. No acute distress. Skin: The cuticles for the right hand are separated mildly for the 2nd-4th fingers. There is mild   paronychial redness, esp. for the 4th finger. The pad of the 4th finger is mildly swollen and moderately   red. No fluctuance noted. No obvious drainage currently. Psych: Alert and oriented x3. Normal mood and affect.  Health Maintenance Due  Topic Date Due   HPV VACCINES (1 - 2-dose series) Never done   HIV Screening  Never done   Hepatitis C Screening  Never done   PAP-Cervical Cytology Screening  Never done   COVID-19 Vaccine (3 - Booster for Pfizer series) 08/23/2020   INFLUENZA VACCINE  06/27/2021     Assessment & Plan:   1. Paronychia of right middle finger I will prescribe an antibiotic for this. I provided instructions regarding doing daily soaks. I recommend she consider using a cuticle oil to try and help resolve the chronic cuticle irritation.  - cephALEXin (KEFLEX) 500 MG capsule; Take 1 capsule (500 mg total) by mouth 2 (two) times daily.  Dispense: 14 capsule; Refill: 0  08/27/2021, MD

## 2021-07-21 NOTE — Patient Instructions (Signed)
Paronychia Paronychia is an infection of the skin that surrounds a nail. It usually affects the skin around a fingernail, but it may also occur near a toenail. It often causes pain and swelling around the nail. In some cases, a collection of pus (abscess) can form near or under the nail.  This condition may develop suddenly, or it may develop gradually over a longer period. In most cases, paronychia is not serious, and it will clear up withtreatment. What are the causes? This condition may be caused by bacteria or a fungus. These germs can enter thebody through an opening in the skin, such as a cut or a hangnail. What increases the risk? This condition is more likely to develop in people who: Get their hands wet often, such as those who work as Fish farm manager, bartenders, or nurses. Bite their fingernails or suck their thumbs. Trim their nails very short. Have hangnails or injured fingertips. Get manicures. Have diabetes. What are the signs or symptoms? Symptoms of this condition include: Redness and swelling of the skin near the nail. Tenderness around the nail when you touch the area. Pus-filled bumps under the skin at the base and sides of the nail (cuticle). Fluid or pus under the nail. Throbbing pain in the area. How is this diagnosed? This condition is diagnosed with a physical exam. In some cases, a sample of pus may be tested to determine what type of bacteria or fungus is causing thecondition. How is this treated? Treatment depends on the cause and severity of your condition. If your condition is mild, it may clear up on its own in a few days or after soaking in warm water. If needed, treatment may include: Antibiotic medicine, if your infection is caused by bacteria. Antifungal medicine, if your infection is caused by a fungus. A procedure to drain pus from an abscess. Anti-inflammatory medicine (corticosteroids). Follow these instructions at home: Wound care Keep the affected  area clean. Soak the affected area in warm water, if told to do so by your health care provider. You may be told to do this for 20 minutes, 2-3 times a day. Keep the area dry when you are not soaking it. Do not try to drain an abscess yourself. Follow instructions from your health care provider about how to take care of the affected area. Make sure you: Wash your hands with soap and water before you change your bandage (dressing). If soap and water are not available, use hand sanitizer. Change your dressing as told by your health care provider. If you had an abscess drained, check the area every day for signs of infection. Check for: Redness, swelling, or pain. Fluid or blood. Warmth. Pus or a bad smell. Medicines  Take over-the-counter and prescription medicines only as told by your health care provider. If you were prescribed an antibiotic medicine, take it as told by your health care provider. Do not stop taking the antibiotic even if you start to feel better.  General instructions Avoid contact with harsh chemicals. Do not pick at the affected area. Prevention To prevent this condition from happening again: Wear rubber gloves when washing dishes or doing other tasks that require your hands to get wet. Wear gloves if your hands might come in contact with cleaners or other chemicals. Avoid injuring your nails or fingertips. Do not bite your nails or tear hangnails. Do not cut your nails very short.  Do not cut your cuticles. Use clean nail clippers or scissors when trimming nails. Contact a  health care provider if: Your symptoms get worse or do not improve with treatment. You have continued or increased fluid, blood, or pus coming from the affected area. Your finger or knuckle becomes swollen or difficult to move. Get help right away if you have: A fever or chills. Redness spreading away from the affected area. Joint or muscle pain. Summary Paronychia is an infection of the  skin that surrounds a nail. It often causes pain and swelling around the nail. In some cases, a collection of pus (abscess) can form near or under the nail. This condition may be caused by bacteria or a fungus. These germs can enter the body through an opening in the skin, such as a cut or a hangnail. If your condition is mild, it may clear up on its own in a few days. If needed, treatment may include medicine or a procedure to drain pus from an abscess. To prevent this condition from happening again, wear gloves if doing tasks that require your hands to get wet or to come in contact with chemicals. Also avoid injuring your nails or fingertips. This information is not intended to replace advice given to you by your health care provider. Make sure you discuss any questions you have with your healthcare provider. Document Revised: 09/07/2020 Document Reviewed: 09/08/2020 Elsevier Patient Education  2022 [employer].

## 2021-07-25 ENCOUNTER — Ambulatory Visit: Payer: BC Managed Care – PPO | Admitting: Physical Therapy

## 2021-07-27 ENCOUNTER — Other Ambulatory Visit: Payer: Self-pay

## 2021-07-27 ENCOUNTER — Ambulatory Visit: Payer: BC Managed Care – PPO | Admitting: Physical Therapy

## 2021-07-27 ENCOUNTER — Encounter: Payer: Self-pay | Admitting: Physical Therapy

## 2021-07-27 DIAGNOSIS — R293 Abnormal posture: Secondary | ICD-10-CM | POA: Diagnosis not present

## 2021-07-27 DIAGNOSIS — M545 Low back pain, unspecified: Secondary | ICD-10-CM | POA: Diagnosis not present

## 2021-07-27 DIAGNOSIS — R29898 Other symptoms and signs involving the musculoskeletal system: Secondary | ICD-10-CM

## 2021-07-27 DIAGNOSIS — G8929 Other chronic pain: Secondary | ICD-10-CM | POA: Diagnosis not present

## 2021-07-27 DIAGNOSIS — M6281 Muscle weakness (generalized): Secondary | ICD-10-CM

## 2021-07-27 NOTE — Therapy (Signed)
[facility] [place] [address] [place], [place], [address] Phone: [phone]   Fax:  [phone]  Physical Therapy Treatment  Patient Details  Name: Robyn [name] MRN: [id number] Date of Birth: 1995/12/06 Referring Provider (PT): Herbie Drape, MD   Encounter Date: 07/27/2021   PT End of Session - 07/27/21 0928     Visit Number 5    Number of Visits 8    Date for PT Re-Evaluation 08/23/21    Authorization Type BCBS    PT Start Time 0850   pt late   PT Stop Time 0928    PT Time Calculation (min) 38 min    Activity Tolerance Patient tolerated treatment well    Behavior During Therapy [facility] for tasks assessed/performed             Past Medical History:  Diagnosis Date   Asthma     Past Surgical History:  Procedure Laterality Date   CESAREAN SECTION MULTI-GESTATIONAL N/A 12/09/2020   Procedure: CESAREAN SECTION MULTI-GESTATIONAL;  Surgeon: Marlow Baars, MD;  Location: [facility] LD ORS;  Service: Obstetrics;  Laterality: N/A;   WISDOM TOOTH EXTRACTION     WISDOM TOOTH EXTRACTION N/A 2012    There were no vitals filed for this visit.   Subjective Assessment - 07/27/21 0852     Subjective Has had more pain in the LB without known cause. Has had a lot going on and has not been as consistent with her HEP.    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 Yes    Pain Score 2     Pain Location Back    Pain Descriptors / Indicators Tightness    Pain Type Chronic pain                               [facility] Adult PT Treatment/Exercise - 07/27/21 0001       Lumbar Exercises: Seated   Other Seated Lumbar Exercises thoracic extension over foam roll 10x3"      Lumbar Exercises: Sidelying   Other Sidelying Lumbar Exercises R/L open book with LE elevated on bolster 10x each   c/o stiffness     Lumbar Exercises: Prone   Other Prone Lumbar Exercises prone on  hands 10x3"   cues to maintain hips down     Lumbar Exercises: Quadruped   Madcat/Old Horse 10 reps    Madcat/Old Horse Limitations cueing for rhythmic breathing    Other Quadruped Lumbar Exercises child's pose 5x10"      Knee/Hip Exercises: Stretches   Piriformis Stretch Right;Left;2 reps;30 seconds    Piriformis Stretch Limitations supine KTOS, fig 4   elevated LEs with fig 4 stetch to intensify stretch on L     Knee/Hip Exercises: Supine   Bridges Strengthening;Both;2 sets;10 reps    Bridges Limitations straight leg bridge    Bridges with Clamshell Strengthening;Both;1 set;15 reps   red TB; cues for core contraction before each rep   Other Supine Knee/Hip Exercises alt isometric hip flexion 5x5" each    Other Supine Knee/Hip Exercises alt bent knee fallout 10x red TB, 10x green TB   more challenge with green                   PT Education - 07/27/21 0928     Education Details update to HEP  Person(s) Educated Patient    Methods Explanation;Demonstration;Tactile cues;Verbal cues;Handout    Comprehension Verbalized understanding;Returned demonstration              PT Short Term Goals - 07/13/21 1754       PT SHORT TERM GOAL #1   Title Pt will be I and compliant with initial HEP to progress exercise.    Baseline provided at eval    Time 3    Period Weeks    Status Achieved    Target Date 07/19/21      PT SHORT TERM GOAL #2   Title Pt will demonstrate symmetrical TrA firing for at least 5 seconds, noted by palpation medial to B ASIS.    Baseline unable at eval    Time 3    Period Weeks    Status On-going    Target Date 07/19/21               PT Long Term Goals - 07/13/21 1754       PT LONG TERM GOAL #1   Title Pt will demonstrate neutral pelvic alignment in supine, sidelying and prone, in order to fire inner core cylinder and hip stabilizers without hip flexor and low back compensation.    Baseline tendency toward posterior pelvic tilt, hip  flexors and L QL overactive    Time 8    Period Weeks    Status On-going      PT LONG TERM GOAL #2   Title Pt will demonstrate 4+/5 hip MMT without compensations.    Baseline see flowsheet    Time 8    Period Weeks    Status On-going      PT LONG TERM GOAL #3   Title Pt will demo TrA firing for at least 30 seconds in sitting, supine, and standing while having a conversation, as needed for lumbar support with all activity.    Baseline unable to fire    Time 8    Period Weeks    Status On-going      PT LONG TERM GOAL #4   Title Pt will demonstrate OH squat with proper form and no pain, as needed for lifting children.    Baseline Trunk collapse, B foot ER (L knee pain with foot neutral), knee valgus    Time 8    Period Weeks    Status On-going      PT LONG TERM GOAL #5   Title Pt will be able to carry child without low back pain.    Baseline pain in left low back carrying on L side    Time 8    Period Weeks    Status On-going      PT LONG TERM GOAL #6   Title Pt will be able to return to recreational soccer and gym activity without pain.    Baseline not able to currently    Time 8    Period Weeks    Status On-going                   Plan - 07/27/21 0929     Clinical Impression Statement Patient arrived to session with report of increased LBP this week without known cause. Reports increased stress and decreased compliance with HEP. Worked on gentle lumbopelvic stretching and ROM to address patient's c/o tightness. Patient demonstrated good mobility and tolerance with all exercises. Did report most stiffness with thoracic extension and thoracolumbar rotation exercises. Patient reporting tension over buttock region  which was well-relieved with piriformis stretching. Worked on progressive core strengthening with cueing to elicit TrA contraction before each movement. Patient demonstrated mild/moderate hip/core instability with exercises on ball. Patient reported  understanding of HEP update today and reported slight improvement in stiffness at end of session.    Personal Factors and Comorbidities Time since onset of injury/illness/exacerbation;Past/Current Experience    Examination-Participation Restrictions Community Activity    Stability/Clinical Decision Making Stable/Uncomplicated    Rehab Potential Good    PT Frequency 1x / week    PT Duration 8 weeks    PT Treatment/Interventions ADLs/Self Care Home Management;Electrical Stimulation;Moist Heat;Traction;Iontophoresis 4mg /ml Dexamethasone;Functional mobility training;Therapeutic activities;Therapeutic exercise;Balance training;Neuromuscular re-education;Manual techniques;Patient/family education;Passive range of motion;Dry needling;Joint Manipulations;Spinal Manipulations;Taping    PT Next Visit Plan add core stab/hip strength as pt tolerates, check for hip flexor/low back compensation and make sure she is in neutral pelvic alignment, manual therapy to address soft tissue restrictions    Consulted and Agree with Plan of Care Patient             Patient will benefit from skilled therapeutic intervention in order to improve the following deficits and impairments:  Increased fascial restricitons, Postural dysfunction, Improper body mechanics, Decreased strength, Impaired perceived functional ability, Decreased activity tolerance, Pain, Hypermobility  Visit Diagnosis: Chronic left-sided low back pain without sciatica  Abnormal posture  Muscle weakness (generalized)  Other symptoms and signs involving the musculoskeletal system     Problem List Patient Active Problem List   Diagnosis Date Noted   History of twin pregnancy in prior pregnancy 07/21/2021   S/P C-section 12/11/2020   Asthma 10/15/2018    10/17/2018, PT, DPT 07/27/21 9:31 AM    [facility] [place] [facility] [address] [place], [place], [place] Phone: [phone]   Fax:   [phone]  Name: Aritzel Krusemark MRN: Jeannine Kitten Date of Birth: [birth year]

## 2021-08-03 ENCOUNTER — Other Ambulatory Visit: Payer: Self-pay

## 2021-08-03 ENCOUNTER — Ambulatory Visit: Payer: BC Managed Care – PPO | Attending: Family Medicine | Admitting: Physical Therapy

## 2021-08-03 ENCOUNTER — Encounter: Payer: Self-pay | Admitting: Physical Therapy

## 2021-08-03 DIAGNOSIS — R293 Abnormal posture: Secondary | ICD-10-CM | POA: Insufficient documentation

## 2021-08-03 DIAGNOSIS — M545 Low back pain, unspecified: Secondary | ICD-10-CM | POA: Insufficient documentation

## 2021-08-03 DIAGNOSIS — M6281 Muscle weakness (generalized): Secondary | ICD-10-CM | POA: Diagnosis not present

## 2021-08-03 DIAGNOSIS — G8929 Other chronic pain: Secondary | ICD-10-CM | POA: Diagnosis not present

## 2021-08-03 DIAGNOSIS — R29898 Other symptoms and signs involving the musculoskeletal system: Secondary | ICD-10-CM | POA: Insufficient documentation

## 2021-08-03 NOTE — Therapy (Signed)
[facility] [address] [place], [place], [address] Phone: [phone]   Fax:  [phone]  Physical Therapy Treatment  Patient Details  Name: Robyn [name] MRN: [id number] Date of Birth: 15-Oct-1996 Referring Provider (PT): Herbie Drape, MD   Encounter Date: 08/03/2021   PT End of Session - 08/03/21 1743     Visit Number 6    Number of Visits 8    Date for PT Re-Evaluation 08/23/21    Authorization Type BCBS    PT Start Time 1702    PT Stop Time 1741    PT Time Calculation (min) 39 min    Activity Tolerance Patient tolerated treatment well    Behavior During Therapy [facility] for tasks assessed/performed             Past Medical History:  Diagnosis Date   Asthma     Past Surgical History:  Procedure Laterality Date   CESAREAN SECTION MULTI-GESTATIONAL N/A 12/09/2020   Procedure: CESAREAN SECTION MULTI-GESTATIONAL;  Surgeon: Marlow Baars, MD;  Location: [facility] LD [facility]S;  Service: Obstetrics;  Laterality: N/A;   WISDOM TOOTH EXTRACTION     WISDOM TOOTH EXTRACTION N/A 2012    There were no vitals filed for this visit.   Subjective Assessment - 08/03/21 1703     Subjective Has been doing pretty good. has been going on walks more and that is helping. has noticed the R anterior hip popping when she walks- hurts for a second then goes away.    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 No/denies                               [facility] Adult PT Treatment/Exercise - 08/03/21 0001       Lumbar Exercises: Stretches   Hip Flexor Stretch Right;2 reps;30 seconds    Hip Flexor Stretch Limitations mod thomas with strap    Quad Stretch Right;2 reps;30 seconds    Quad Stretch Limitations prone with strap      Lumbar Exercises: Aerobic   Recumbent Bike L3 x 6 min      Lumbar Exercises: Standing   Other Standing Lumbar Exercises hip hinge with blue TB  10x, 10x 10#   cues for form   Other Standing Lumbar Exercises R/L single leg RDL to mat 10x   cues for form     Knee/Hip Exercises: Standing   Other Standing Knee Exercises R/L 4 way hip on foam with green TB 10x each   cues to avoid using momentum and lateral lean                    PT Education - 08/03/21 1742     Education Details update/consolidation to [employer]) Educated Patient    Methods Explanation;Demonstration;Tactile cues;Verbal cues;Handout    Comprehension Verbalized understanding;Returned demonstration              PT Short Term Goals - 07/13/21 1754       PT SHORT TERM GOAL #1   Title Pt will be I and compliant with initial HEP to progress exercise.    Baseline provided at eval    Time 3    Period Weeks    Status Achieved    Target Date 07/19/21      PT SHORT TERM GOAL #2  Title Pt will demonstrate symmetrical TrA firing for at least 5 seconds, noted by palpation medial to B ASIS.    Baseline unable at eval    Time 3    Period Weeks    Status On-going    Target Date 07/19/21               PT Long Term Goals - 07/13/21 1754       PT LONG TERM GOAL #1   Title Pt will demonstrate neutral pelvic alignment in supine, sidelying and prone, in order to fire inner core cylinder and hip stabilizers without hip flexor and low back compensation.    Baseline tendency toward posterior pelvic tilt, hip flexors and L QL overactive    Time 8    Period Weeks    Status On-going      PT LONG TERM GOAL #2   Title Pt will demonstrate 4+/5 hip MMT without compensations.    Baseline see flowsheet    Time 8    Period Weeks    Status On-going      PT LONG TERM GOAL #3   Title Pt will demo TrA firing for at least 30 seconds in sitting, supine, and standing while having a conversation, as needed for lumbar support with all activity.    Baseline unable to fire    Time 8    Period Weeks    Status On-going      PT LONG TERM GOAL #4   Title  Pt will demonstrate OH squat with proper form and no pain, as needed for lifting children.    Baseline Trunk collapse, B foot ER (L knee pain with foot neutral), knee valgus    Time 8    Period Weeks    Status On-going      PT LONG TERM GOAL #5   Title Pt will be able to carry child without low back pain.    Baseline pain in left low back carrying on L side    Time 8    Period Weeks    Status On-going      PT LONG TERM GOAL #6   Title Pt will be able to return to recreational soccer and gym activity without pain.    Baseline not able to currently    Time 8    Period Weeks    Status On-going                   Plan - 08/03/21 1743     Clinical Impression Statement Patient reporting sensation of R anterior hip popping when walking recently which dissipates quickly. Worked on gentle hip flexor and quad stretching after educating patient on snapping hip. Continued working on hip hinging ther-ex with patient demonstrating good carryover from previous sessions. Able to increase banded and weighted resistance. Single leg hip strengthening demonstrated some ankle instability, R>L, particularly when providing compliant surface. Updated and consolidated HEP for max benefit. Patient reported understanding and without complaints at end of session.    Personal Factors and Comorbidities Time since onset of injury/illness/exacerbation;Past/Current Experience    Examination-Participation Restrictions Community Activity    Stability/Clinical Decision Making Stable/Uncomplicated    Rehab Potential Good    PT Frequency 1x / week    PT Duration 8 weeks    PT Treatment/Interventions ADLs/Self Care Home Management;Electrical Stimulation;Moist Heat;Traction;Iontophoresis 4mg /ml Dexamethasone;Functional mobility training;Therapeutic activities;Therapeutic exercise;Balance training;Neuromuscular re-education;Manual techniques;Patient/family education;Passive range of motion;Dry needling;Joint  Manipulations;Spinal Manipulations;Taping    PT Next Visit Plan  add core stab/hip strength as pt tolerates, check for hip flexor/low back compensation and make sure she is in neutral pelvic alignment, manual therapy to address soft tissue restrictions    Consulted and Agree with Plan of Care Patient             Patient will benefit from skilled therapeutic intervention in order to improve the following deficits and impairments:  Increased fascial restricitons, Postural dysfunction, Improper body mechanics, Decreased strength, Impaired perceived functional ability, Decreased activity tolerance, Pain, Hypermobility  Visit Diagnosis: Chronic left-sided low back pain without sciatica  Abnormal posture  Muscle weakness (generalized)  Other symptoms and signs involving the musculoskeletal system     Problem List Patient Active Problem List   Diagnosis Date Noted   History of twin pregnancy in prior pregnancy 07/21/2021   S/P C-section 12/11/2020   Asthma 10/15/2018     Anette Guarneri, PT, DPT 08/03/21 5:45 PM   [facility] [place] [facility] [address] [place], [place], [address] Phone: [phone]   Fax:  [phone]  Name: Cerena Baine MRN: [id number] Date of Birth: Oct 15, 1996

## 2021-08-03 NOTE — Patient Instructions (Signed)
Access Code: [email or web address] URL: [email or web address] Date: 08/03/2021 Prepared by: Georgina Peer  Exercises Side Stepping with Resistance at Feet - 2 x daily - 7 x weekly - 2 sets - 10 reps Single Leg Bridge - 2 x daily - 7 x weekly - 2 sets - 10 reps Forward T - 2 x daily - 7 x weekly - 2 sets - 10 reps

## 2021-08-11 ENCOUNTER — Ambulatory Visit: Payer: BC Managed Care – PPO | Admitting: Physical Therapy

## 2021-08-11 ENCOUNTER — Other Ambulatory Visit: Payer: Self-pay

## 2021-08-11 DIAGNOSIS — R29898 Other symptoms and signs involving the musculoskeletal system: Secondary | ICD-10-CM

## 2021-08-11 DIAGNOSIS — R293 Abnormal posture: Secondary | ICD-10-CM

## 2021-08-11 DIAGNOSIS — G8929 Other chronic pain: Secondary | ICD-10-CM

## 2021-08-11 DIAGNOSIS — M545 Low back pain, unspecified: Secondary | ICD-10-CM | POA: Diagnosis not present

## 2021-08-11 DIAGNOSIS — M6281 Muscle weakness (generalized): Secondary | ICD-10-CM | POA: Diagnosis not present

## 2021-08-11 NOTE — Therapy (Signed)
[place]. [place], [place], [address] Phone: [phone]   Fax:  [phone]  Physical Therapy Treatment  Patient Details  Name: Robyn [name] MRN: [id number] Date of Birth: 1995/12/26 Referring Provider (PT): Arlester Marker, MD   Encounter Date: 08/11/2021   PT End of Session - 08/11/21 1758     Visit Number 7    Number of Visits 8    Date for PT Re-Evaluation 08/23/21    Authorization Type BCBS    PT Start Time 1700    PT Stop Time 5993    PT Time Calculation (min) 48 min    Activity Tolerance Patient tolerated treatment well    Behavior During Therapy [facility] for tasks assessed/performed             Past Medical History:  Diagnosis Date   Asthma     Past Surgical History:  Procedure Laterality Date   CESAREAN SECTION MULTI-GESTATIONAL N/A 12/09/2020   Procedure: CESAREAN SECTION MULTI-GESTATIONAL;  Surgeon: [name], MD;  Location: [facility] LD ORS;  Service: Obstetrics;  Laterality: N/A;   WISDOM TOOTH EXTRACTION     WISDOM TOOTH EXTRACTION N/A 2012    There were no vitals filed for this visit.   Subjective Assessment - 08/11/21 1701     Subjective Been a long week so far. Feels that she would like to try a 30 dya hold today.    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 No/denies                [facility] PT Assessment - 08/11/21 1711       Assessment   Referring Provider (PT) Arlester Marker, MD    Onset Date/Surgical Date --   during pregnancy     Functional Tests   Functional tests --   Still demonstrating L>R multifidus compensation with R/L hip extension     Squat   Comments Overhead squat with slightly increased R wt shift, and trunk falling forward      Strength   Right Hip Flexion 5/5    Right Hip Extension 4+/5    Right Hip External Rotation  4+/5    Right Hip Internal Rotation 5/5    Right Hip ABduction 5/5     Right Hip ADduction 4+/5    Left Hip Flexion 5/5    Left Hip Extension 4+/5    Left Hip External Rotation 4+/5    Left Hip Internal Rotation 5/5    Left Hip ABduction 5/5    Left Hip ADduction 4+/5                           [facility] Adult PT Treatment/Exercise - 08/11/21 0001       Lumbar Exercises: Aerobic   Recumbent Bike L3 x 6 min      Lumbar Exercises: Supine   Other Supine Lumbar Exercises hooklying TrA 5", sitting TrA 30 sec   more prominent L>R contraction in hookyling, inability to maintain >10 sec i sitting           Treatment:  Yellow medball overhead 90/90 core rotations 5x each Blue medball overhead alt leg drops with knees straight 10x Alt birddog crunch 10x each side- manual assistance to avoid trunk rotation  Side plank + clamshell red TB 10x each- c/o challenge and fatigue  PT Education - 08/11/21 1757     Education Details discussion on objective progress and remaining impairments; update to HEP; discussion on appropriate progression of excercise    Person(s) Educated Patient    Methods Explanation;Demonstration;Tactile cues;Verbal cues;Handout    Comprehension Verbalized understanding              PT Short Term Goals - 08/11/21 1802       PT SHORT TERM GOAL #1   Title Pt will be I and compliant with initial HEP to progress exercise.    Baseline provided at eval    Time 3    Period Weeks    Status Achieved    Target Date 07/19/21      PT SHORT TERM GOAL #2   Title Pt will demonstrate symmetrical TrA firing for at least 5 seconds, noted by palpation medial to B ASIS.    Baseline unable at eval    Time 3    Period Weeks    Status Partially Met   improved, however L>R contraction   Target Date 07/19/21               PT Long Term Goals - 08/11/21 1802       PT LONG TERM GOAL #1   Title Pt will demonstrate neutral pelvic alignment in supine, sidelying and prone, in order to fire inner core cylinder and  hip stabilizers without hip flexor and low back compensation.    Baseline tendency toward posterior pelvic tilt, hip flexors and L QL overactive    Time 8    Period Weeks    Status Partially Met   much improved; still with L>R multifidus compensation with hip extension     PT LONG TERM GOAL #2   Title Pt will demonstrate 4+/5 hip MMT without compensations.    Baseline see flowsheet    Time 8    Period Weeks    Status Achieved      PT LONG TERM GOAL #3   Title Pt will demo TrA firing for at least 30 seconds in sitting, supine, and standing while having a conversation, as needed for lumbar support with all activity.    Baseline unable to fire    Time 8    Period Weeks    Status On-going   limited to 10 sec in sitting     PT LONG TERM GOAL #4   Title Pt will demonstrate OH squat with proper form and no pain, as needed for lifting children.    Baseline Trunk collapse, B foot ER (L knee pain with foot neutral), knee valgus    Time 8    Period Weeks    Status Partially Met   no pain, but still with limited form     PT LONG TERM GOAL #5   Title Pt will be able to carry child without low back pain.    Baseline pain in left low back carrying on L side    Time 8    Period Weeks    Status Achieved      PT LONG TERM GOAL #6   Title Pt will be able to return to recreational soccer and gym activity without pain.    Baseline not able to currently    Time 8    Period Weeks    Status Achieved   returned to jogging without pain                  Plan -  08/11/21 1759     Clinical Impression Statement Patient arrived to session without new complaints. Reports that she would like to transition to 30 day hold at this time. Patient reports that she has returned to jogging a couple times a week with the jogging stroller without pain during running, but some tightness after. Also reports return to lifting her kids without pain; pain only coming on after several hours. Strength testing  revealed considerable improvement in B hip strength, weakest muscle groups at 4+/5. Patient has met or partially met all goals at this time. Progressed core strengthening today for increased challenge which patient tolerated well. Reviewed appropriate fitness progression as well as transfer of TrA contraction to ADLs. Patient tolerated session well and without complaints at end of session. Placing patient on 30 day hold at this time.    Personal Factors and Comorbidities Time since onset of injury/illness/exacerbation;Past/Current Experience    Examination-Participation Restrictions Community Activity    Stability/Clinical Decision Making Stable/Uncomplicated    Rehab Potential Good    PT Frequency 1x / week    PT Duration 8 weeks    PT Treatment/Interventions ADLs/Self Care Home Management;Electrical Stimulation;Moist Heat;Traction;Iontophoresis 61m/ml Dexamethasone;Functional mobility training;Therapeutic activities;Therapeutic exercise;Balance training;Neuromuscular re-education;Manual techniques;Patient/family education;Passive range of motion;Dry needling;Joint Manipulations;Spinal Manipulations;Taping    PT Next Visit Plan 30 day hold at this time    Consulted and Agree with Plan of Care Patient             Patient will benefit from skilled therapeutic intervention in order to improve the following deficits and impairments:  Increased fascial restricitons, Postural dysfunction, Improper body mechanics, Decreased strength, Impaired perceived functional ability, Decreased activity tolerance, Pain, Hypermobility  Visit Diagnosis: Chronic left-sided low back pain without sciatica  Abnormal posture  Muscle weakness (generalized)  Other symptoms and signs involving the musculoskeletal system     Problem List Patient Active Problem List   Diagnosis Date Noted   History of twin pregnancy in prior pregnancy 07/21/2021   S/P C-section 12/11/2020   Asthma 10/15/2018     YJanene Harvey PT, DPT 08/11/21 6:04 PM   [place] [address]Phone: [phone]  Fax:  [phone] Name: HJaquelin MeaneyMRN: [id number] of Birth: [birth year]

## 2021-09-30 ENCOUNTER — Other Ambulatory Visit: Payer: Self-pay

## 2021-09-30 ENCOUNTER — Encounter: Payer: Self-pay | Admitting: Nurse Practitioner

## 2021-09-30 ENCOUNTER — Ambulatory Visit (INDEPENDENT_AMBULATORY_CARE_PROVIDER_SITE_OTHER): Payer: BC Managed Care – PPO | Admitting: Nurse Practitioner

## 2021-09-30 VITALS — BP 120/82 | HR 78 | Temp 97.7°F | Ht 65.0 in | Wt 146.4 lb

## 2021-09-30 DIAGNOSIS — L03011 Cellulitis of right finger: Secondary | ICD-10-CM | POA: Diagnosis not present

## 2021-09-30 MED ORDER — CEPHALEXIN 500 MG PO CAPS: 500.0000 mg | ORAL_CAPSULE | Freq: Three times a day (TID) | ORAL | 0 refills | Status: DC

## 2021-09-30 NOTE — Patient Instructions (Addendum)
Avoid use of artificial nails. Use gloves when washing dishes and bathing the kids to avoid excessive exposure to moisture. Soak hand in warm water and vinegar solution TID, each time. Use bactroban ointment BID X1week. If no improvement, start oral antibiotics(keflex).  Paronychia Paronychia is an infection of the skin. It happens near a fingernail or toenail. It may cause pain and swelling around the nail. In some cases, a fluid-filled bump (abscess) can form near or under the nail. Often, this condition is not serious, and it clears up with treatment. What are the causes? This condition may be caused by a germ. The germ may be bacteria or a fungus. These germs can enter the body through an opening in the skin, such as a cut or a hangnail. Other causes include: Repeated injuries to your fingernails or toenails. Irritation of the base and sides of the nail (cuticle). What increases the risk? This condition is more likely to develop in people who: Get their hands wet often, such as a dishwasher. Bite their fingernails or the base and sides of their nails. Have other skin problems. Have hangnails or hurt fingertips. Come into contact with chemicals like detergents. Have diabetes. What are the signs or symptoms? Redness and swelling of the skin near the nail. A tender feeling around the nail. Pus-filled bumps under the skin at the base and sides of the nail. Fluid or pus under the nail. Pain in the area. How is this treated? Treatment depends on the cause of your condition and how bad it is. If your condition is mild, it may clear up on its own in a few days or after soaking in warm water. If needed, treatment may include: Antibiotic medicine. Antifungal medicine. A procedure to drain pus from a fluid-filled bump. Medicine to treat irritation and swelling (corticosteroids). Taking off part of an ingrown toenail. A bandage (dressing) may be placed over the nail area. Follow  these instructions at home: Wound care Keep the affected area clean. Soak the fingers or toes in warm water as told by your doctor. You may be told to do this for 20 minutes, 2-3 times a day. Keep the area dry when you are not soaking it. Do not try to drain a fluid-filled bump on your own. Follow instructions from your doctor about how to take care of the affected area. Make sure you: Wash your hands with soap and water for at least 20 seconds before and after you change your bandage. If you cannot use soap and water, use hand sanitizer. Change your bandage as told by your doctor. If you had a fluid-filled bump and your doctor drained it, check the area every day for signs of infection. Check for: Redness, swelling, or pain. Fluid or blood. Warmth. Pus or a bad smell. Medicines  Take over-the-counter and prescription medicines only as told by your doctor. If you were prescribed an antibiotic medicine, take it as told by your doctor. Do not stop taking it even if you start to feel better. General instructions Avoid contact with anything that irritates your skin or that you are allergic to. Do not pick at the affected area. Keep all follow-up visits. Prevention To prevent this condition from happening again: Wear rubber gloves when putting your hands in water for washing dishes or other tasks. Wear gloves if your hands might touch cleaners or chemicals. Avoid injuring your nails or fingertips. Do not bite your nails or tear hangnails. Do not cut your nails very short.  Do not cut the skin at the base and sides of the nail. Use clean nail clippers or scissors when trimming nails. Contact a doctor if: You feel worse. You do not get better. You keep having or you have more fluid, blood, or pus coming from the affected area. Your affected finger, toe, or joint gets swollen or hard to move. You have a fever or chills. There is redness spreading from the affected  area. Summary Paronychia is an infection of the skin. It happens near a fingernail or toenail. This condition may cause pain and swelling around the nail. Soak the fingers or toes in warm water as told by your doctor. Often, this condition is not serious, and it clears up with treatment. This information is not intended to replace advice given to you by your health care provider. Make sure you discuss any questions you have with your health care provider. Document Revised: 02/14/2021 Document Reviewed: 02/14/2021 Elsevier Patient Education  2022 [employer].

## 2021-09-30 NOTE — Progress Notes (Signed)
   Subjective:  Patient ID: Robyn [name], female    DOB: [birth year]  Age: 25 y.o. MRN: [id number]  CC: Acute Visit (Pt c/o possible infection in her finger. Pt states two fingers on her right hand have been swollen, red, and painful x 1 week. Pt states it is always around her cuticles. )  HPI Robyn [name] presents with recurrent inflamed cuticles, worse of right hand. Admits to picking her nails and cuticles but does not bite them. She uses artficial nails and glue to deter from picking her nails. She had 37month old kids, so has to constantly wash bottles and clothes. She stopped wearing artifical nails for 3weeks, but no improvement. She was treated with keflex BID x 7days in August with minimal improvement. UTD with TDAP  Reviewed past Medical, Social and Family history today.  Outpatient Medications Prior to Visit  Medication Sig Dispense Refill   acetaminophen (TYLENOL) 500 MG tablet Take 2 tablets (1,000 mg total) by mouth every 6 (six) hours as needed for headache.     Ibuprofen 200 MG CAPS Advil     Prenatal-FeFum-FA-DHA w/o A (PRENATAL + DHA PO) Take 1 tablet by mouth daily. (Patient not taking: Reported on 09/30/2021)     cephALEXin (KEFLEX) 500 MG capsule Take 1 capsule (500 mg total) by mouth 2 (two) times daily. (Patient not taking: Reported on 09/30/2021) 14 capsule 0   No facility-administered medications prior to visit.   ROS See HPI  Objective:  BP 120/82 (BP Location: Left Arm, Patient Position: Sitting, Cuff Size: Normal)   Pulse 78   Temp 97.7 F (36.5 C) (Temporal)   Ht 5\' 5"  (1.651 m)   Wt 146 lb 6.4 oz (66.4 kg)   LMP 09/13/2021 (Approximate)   SpO2 98%   Breastfeeding No   BMI 24.36 kg/m   Physical Exam Musculoskeletal:     Right hand: No swelling, deformity or bony tenderness. Normal range of motion.     Left hand: No swelling, deformity or bony tenderness. Normal range of motion.     Comments: Erythematous cuticles, scant purulent drainage from right 3rd  and 4th fingers. No joint swelling.   Assessment & Plan:  This visit occurred during the SARS-CoV-2 public health emergency.  Safety protocols were in place, including screening questions prior to the visit, additional usage of staff PPE, and extensive cleaning of exam room while observing appropriate contact time as indicated for disinfecting solutions.   Robyn [name] was seen today for acute visit.  Diagnoses and all orders for this visit:  Paronychia of finger of right hand -     cephALEXin (KEFLEX) 500 MG capsule; Take 1 capsule (500 mg total) by mouth 3 (three) times daily.  Avoid use of artificial nails. Use gloves when washing dishes and bathing the kids to avoid excessive exposure to moisture. Soak hand in warm water and vinegar solution TID, Robyn [name] each time. Use bactroban ointment BID X1week. If no improvement, start oral antibiotics(keflex).  Problem List Items Addressed This Visit   None Visit Diagnoses     Paronychia of finger of right hand    -  Primary   Relevant Medications   cephALEXin (KEFLEX) 500 MG capsule (Start on 10/07/2021)      Follow-up: No follow-ups on file.  13/09/2021, NP

## 2021-12-19 DIAGNOSIS — Z6824 Body mass index (BMI) 24.0-24.9, adult: Secondary | ICD-10-CM | POA: Diagnosis not present

## 2021-12-19 DIAGNOSIS — B3731 Acute candidiasis of vulva and vagina: Secondary | ICD-10-CM | POA: Diagnosis not present

## 2021-12-19 DIAGNOSIS — N911 Secondary amenorrhea: Secondary | ICD-10-CM | POA: Diagnosis not present

## 2021-12-29 ENCOUNTER — Encounter: Payer: Self-pay | Admitting: Physical Therapy

## 2021-12-29 NOTE — Therapy (Signed)
[facility] [place], [place], [address] Phone: [phone]   Fax:  [phone]  Patient Details  Name: Robyn [name] MRN: [id number] Date of Birth: Dec 23, 1995 Referring Provider:  No ref. provider found  Encounter Date: 12/29/2021  PHYSICAL THERAPY DISCHARGE SUMMARY  Visits from Start of Care: 7  Current functional level related to goals / functional outcomes: Did not return since last scheduled session September 2022, per chart looks like she requested being put on hold due to feeling well; FOTO DC complete 12/29/21    Remaining deficits: Unable to assess    Education / Equipment: Unable to assess    Patient agrees to discharge. Patient goals were partially met. Patient is being discharged due to being pleased with the current functional level.  Ann Lions PT, DPT, PN2   Supplemental Physical Therapist [address] [place], [place], [address] Phone: [phone]   Fax:  [phone]

## 2022-01-09 DIAGNOSIS — N925 Other specified irregular menstruation: Secondary | ICD-10-CM | POA: Diagnosis not present

## 2022-01-09 DIAGNOSIS — Z6824 Body mass index (BMI) 24.0-24.9, adult: Secondary | ICD-10-CM | POA: Diagnosis not present

## 2022-01-09 DIAGNOSIS — Z3480 Encounter for supervision of other normal pregnancy, unspecified trimester: Secondary | ICD-10-CM | POA: Diagnosis not present

## 2022-01-09 DIAGNOSIS — Z113 Encounter for screening for infections with a predominantly sexual mode of transmission: Secondary | ICD-10-CM | POA: Diagnosis not present

## 2022-01-09 LAB — OB RESULTS CONSOLE GC/CHLAMYDIA
Chlamydia: NEGATIVE
Neisseria Gonorrhea: NEGATIVE

## 2022-01-10 ENCOUNTER — Encounter: Payer: Self-pay | Admitting: Family Medicine

## 2022-01-31 DIAGNOSIS — Z369 Encounter for antenatal screening, unspecified: Secondary | ICD-10-CM | POA: Diagnosis not present

## 2022-01-31 DIAGNOSIS — Z3481 Encounter for supervision of other normal pregnancy, first trimester: Secondary | ICD-10-CM | POA: Diagnosis not present

## 2022-01-31 DIAGNOSIS — Z3480 Encounter for supervision of other normal pregnancy, unspecified trimester: Secondary | ICD-10-CM | POA: Diagnosis not present

## 2022-01-31 LAB — OB RESULTS CONSOLE RPR: RPR: NONREACTIVE

## 2022-01-31 LAB — OB RESULTS CONSOLE RUBELLA ANTIBODY, IGM: Rubella: IMMUNE

## 2022-01-31 LAB — HEPATITIS C ANTIBODY: HCV Ab: NEGATIVE

## 2022-01-31 LAB — OB RESULTS CONSOLE HIV ANTIBODY (ROUTINE TESTING): HIV: NONREACTIVE

## 2022-01-31 LAB — OB RESULTS CONSOLE HEPATITIS B SURFACE ANTIGEN: Hepatitis B Surface Ag: NEGATIVE

## 2022-01-31 LAB — OB RESULTS CONSOLE ANTIBODY SCREEN: Antibody Screen: NEGATIVE

## 2022-02-21 DIAGNOSIS — Z369 Encounter for antenatal screening, unspecified: Secondary | ICD-10-CM | POA: Diagnosis not present

## 2022-03-15 DIAGNOSIS — Z3A19 19 weeks gestation of pregnancy: Secondary | ICD-10-CM | POA: Diagnosis not present

## 2022-03-15 DIAGNOSIS — Z363 Encounter for antenatal screening for malformations: Secondary | ICD-10-CM | POA: Diagnosis not present

## 2022-04-12 DIAGNOSIS — Z3A23 23 weeks gestation of pregnancy: Secondary | ICD-10-CM | POA: Diagnosis not present

## 2022-04-12 DIAGNOSIS — Z369 Encounter for antenatal screening, unspecified: Secondary | ICD-10-CM | POA: Diagnosis not present

## 2022-05-10 DIAGNOSIS — Z23 Encounter for immunization: Secondary | ICD-10-CM | POA: Diagnosis not present

## 2022-05-10 DIAGNOSIS — Z3480 Encounter for supervision of other normal pregnancy, unspecified trimester: Secondary | ICD-10-CM | POA: Diagnosis not present

## 2022-05-10 LAB — HM PAP SMEAR

## 2022-06-01 DIAGNOSIS — Z369 Encounter for antenatal screening, unspecified: Secondary | ICD-10-CM | POA: Diagnosis not present

## 2022-06-15 DIAGNOSIS — Z369 Encounter for antenatal screening, unspecified: Secondary | ICD-10-CM | POA: Diagnosis not present

## 2022-06-27 DIAGNOSIS — Z3A34 34 weeks gestation of pregnancy: Secondary | ICD-10-CM | POA: Diagnosis not present

## 2022-06-27 DIAGNOSIS — O26843 Uterine size-date discrepancy, third trimester: Secondary | ICD-10-CM | POA: Diagnosis not present

## 2022-07-07 DIAGNOSIS — O365931 Maternal care for other known or suspected poor fetal growth, third trimester, fetus 1: Secondary | ICD-10-CM | POA: Diagnosis not present

## 2022-07-07 DIAGNOSIS — Z3A35 35 weeks gestation of pregnancy: Secondary | ICD-10-CM | POA: Diagnosis not present

## 2022-07-12 DIAGNOSIS — O365993 Maternal care for other known or suspected poor fetal growth, unspecified trimester, fetus 3: Secondary | ICD-10-CM | POA: Diagnosis not present

## 2022-07-12 DIAGNOSIS — Z3A36 36 weeks gestation of pregnancy: Secondary | ICD-10-CM | POA: Diagnosis not present

## 2022-07-20 ENCOUNTER — Encounter (HOSPITAL_COMMUNITY): Payer: Self-pay | Admitting: *Deleted

## 2022-07-20 ENCOUNTER — Encounter (HOSPITAL_COMMUNITY): Payer: Self-pay

## 2022-07-20 NOTE — Patient Instructions (Addendum)
Tawyna Pellot  07/20/2022   Your procedure is scheduled on:  08/03/2022  Arrive at 1000 at Entrance C on [address] at [facility]  and [employer]. You are invited to use the FREE valet parking or use the Visitor's parking deck.  Pick up the phone at the desk and dial [phone].  Call this number if you have problems the morning of surgery: [phone]  Remember:   Do not eat food:(After Midnight) Desps de medianoche.  Do not drink clear liquids: (4 Hours before arrival) 4 horas ante llegada.  Take these medicines the morning of surgery with A SIP OF WATER:  none   Do not wear jewelry, make-up or nail polish.  Do not wear lotions, powders, or perfumes. Do not wear deodorant.  Do not shave 48 hours prior to surgery.  Do not bring valuables to the hospital.  [facility] is not   responsible for any belongings or valuables brought to the hospital.  Contacts, dentures or bridgework may not be worn into surgery.  Leave suitcase in the car. After surgery it may be brought to your room.  For patients admitted to the hospital, checkout time is 11:00 AM the day of              discharge.      Please read over the following fact sheets that you were given:     Preparing for Surgery

## 2022-07-21 DIAGNOSIS — O365931 Maternal care for other known or suspected poor fetal growth, third trimester, fetus 1: Secondary | ICD-10-CM | POA: Diagnosis not present

## 2022-07-24 DIAGNOSIS — O365931 Maternal care for other known or suspected poor fetal growth, third trimester, fetus 1: Secondary | ICD-10-CM | POA: Diagnosis not present

## 2022-07-28 DIAGNOSIS — O365939 Maternal care for other known or suspected poor fetal growth, third trimester, other fetus: Secondary | ICD-10-CM | POA: Diagnosis not present

## 2022-07-28 DIAGNOSIS — Z3A38 38 weeks gestation of pregnancy: Secondary | ICD-10-CM | POA: Diagnosis not present

## 2022-08-01 ENCOUNTER — Encounter (HOSPITAL_COMMUNITY)
Admission: RE | Admit: 2022-08-01 | Discharge: 2022-08-01 | Disposition: A | Discharge status: Home / Self Care | Payer: BC Managed Care – PPO | Source: Ambulatory Visit | Attending: Obstetrics and Gynecology | Admitting: Obstetrics and Gynecology

## 2022-08-01 DIAGNOSIS — O99824 Streptococcus B carrier state complicating childbirth: Secondary | ICD-10-CM | POA: Diagnosis not present

## 2022-08-01 DIAGNOSIS — O365930 Maternal care for other known or suspected poor fetal growth, third trimester, not applicable or unspecified: Secondary | ICD-10-CM | POA: Diagnosis not present

## 2022-08-01 DIAGNOSIS — Z3483 Encounter for supervision of other normal pregnancy, third trimester: Secondary | ICD-10-CM | POA: Insufficient documentation

## 2022-08-01 DIAGNOSIS — O34211 Maternal care for low transverse scar from previous cesarean delivery: Secondary | ICD-10-CM | POA: Diagnosis not present

## 2022-08-01 DIAGNOSIS — Z6791 Unspecified blood type, Rh negative: Secondary | ICD-10-CM | POA: Diagnosis not present

## 2022-08-01 DIAGNOSIS — O26893 Other specified pregnancy related conditions, third trimester: Secondary | ICD-10-CM | POA: Diagnosis not present

## 2022-08-01 DIAGNOSIS — Z3A39 39 weeks gestation of pregnancy: Secondary | ICD-10-CM | POA: Diagnosis not present

## 2022-08-01 DIAGNOSIS — Z302 Encounter for sterilization: Secondary | ICD-10-CM | POA: Diagnosis not present

## 2022-08-01 LAB — CBC
HCT: 35.3 % — ABNORMAL LOW (ref 36.0–46.0)
Hemoglobin: 12.2 g/dL (ref 12.0–15.0)
MCH: 32 pg (ref 26.0–34.0)
MCHC: 34.6 g/dL (ref 30.0–36.0)
MCV: 92.7 fL (ref 80.0–100.0)
Platelets: 258 10*3/uL (ref 150–400)
RBC: 3.81 MIL/uL — ABNORMAL LOW (ref 3.87–5.11)
RDW: 14.5 % (ref 11.5–15.5)
WBC: 9.2 10*3/uL (ref 4.0–10.5)
nRBC: 0 % (ref 0.0–0.2)

## 2022-08-01 LAB — TYPE AND SCREEN
ABO/RH(D): A POS
Antibody Screen: NEGATIVE

## 2022-08-01 LAB — RPR: RPR Ser Ql: NONREACTIVE

## 2022-08-03 ENCOUNTER — Other Ambulatory Visit: Payer: Self-pay

## 2022-08-03 ENCOUNTER — Encounter (HOSPITAL_COMMUNITY): Payer: Self-pay | Admitting: Obstetrics

## 2022-08-03 ENCOUNTER — Inpatient Hospital Stay (HOSPITAL_COMMUNITY): Payer: BC Managed Care – PPO | Admitting: Anesthesiology

## 2022-08-03 ENCOUNTER — Inpatient Hospital Stay (HOSPITAL_COMMUNITY)
Admission: RE | Admit: 2022-08-03 | Discharge: 2022-08-06 | DRG: 785 | Disposition: A | Discharge status: Home / Self Care | Payer: BC Managed Care – PPO | Attending: Obstetrics | Admitting: Obstetrics

## 2022-08-03 ENCOUNTER — Encounter (HOSPITAL_COMMUNITY)
Admission: RE | Disposition: A | Discharge status: Home / Self Care | Payer: Self-pay | Source: Home / Self Care | Attending: Obstetrics

## 2022-08-03 DIAGNOSIS — O365930 Maternal care for other known or suspected poor fetal growth, third trimester, not applicable or unspecified: Secondary | ICD-10-CM | POA: Diagnosis present

## 2022-08-03 DIAGNOSIS — Z302 Encounter for sterilization: Secondary | ICD-10-CM

## 2022-08-03 DIAGNOSIS — O34211 Maternal care for low transverse scar from previous cesarean delivery: Principal | ICD-10-CM | POA: Diagnosis present

## 2022-08-03 DIAGNOSIS — Z3483 Encounter for supervision of other normal pregnancy, third trimester: Principal | ICD-10-CM

## 2022-08-03 DIAGNOSIS — Z6791 Unspecified blood type, Rh negative: Secondary | ICD-10-CM

## 2022-08-03 DIAGNOSIS — O26893 Other specified pregnancy related conditions, third trimester: Secondary | ICD-10-CM | POA: Diagnosis present

## 2022-08-03 DIAGNOSIS — Z98891 History of uterine scar from previous surgery: Secondary | ICD-10-CM

## 2022-08-03 DIAGNOSIS — Z3A39 39 weeks gestation of pregnancy: Secondary | ICD-10-CM | POA: Diagnosis not present

## 2022-08-03 DIAGNOSIS — O99824 Streptococcus B carrier state complicating childbirth: Secondary | ICD-10-CM | POA: Diagnosis present

## 2022-08-03 SURGERY — Surgical Case
Anesthesia: Spinal | Laterality: Bilateral

## 2022-08-03 MED ORDER — DEXAMETHASONE SODIUM PHOSPHATE 10 MG/ML IJ SOLN
INTRAMUSCULAR | Status: AC
  Filled 2022-08-03: qty 1

## 2022-08-03 MED ORDER — ACETAMINOPHEN 500 MG PO TABS
1000.0000 mg | ORAL_TABLET | Freq: Four times a day (QID) | ORAL | Status: DC
  Administered 2022-08-03 – 2022-08-06 (×11): 1000 mg via ORAL
  Filled 2022-08-03 (×11): qty 2

## 2022-08-03 MED ORDER — NALOXONE HCL 4 MG/10ML IJ SOLN: 1.0000 ug/kg/h | INTRAVENOUS | Status: DC | PRN

## 2022-08-03 MED ORDER — KETOROLAC TROMETHAMINE 30 MG/ML IJ SOLN
30.0000 mg | Freq: Four times a day (QID) | INTRAMUSCULAR | Status: AC | PRN
Start: 2022-08-03 — End: 2022-08-04

## 2022-08-03 MED ORDER — WITCH HAZEL-GLYCERIN EX PADS: 1.0000 | MEDICATED_PAD | CUTANEOUS | Status: DC | PRN

## 2022-08-03 MED ORDER — SENNOSIDES-DOCUSATE SODIUM 8.6-50 MG PO TABS
2.0000 | ORAL_TABLET | ORAL | Status: DC
  Administered 2022-08-03 – 2022-08-06 (×3): 2 via ORAL
  Filled 2022-08-03 (×3): qty 2

## 2022-08-03 MED ORDER — MENTHOL 3 MG MT LOZG: 1.0000 | LOZENGE | OROMUCOSAL | Status: DC | PRN

## 2022-08-03 MED ORDER — OXYCODONE HCL 5 MG PO TABS
5.0000 mg | ORAL_TABLET | Freq: Four times a day (QID) | ORAL | Status: DC | PRN
  Administered 2022-08-04: 5 mg via ORAL
  Filled 2022-08-03: qty 1

## 2022-08-03 MED ORDER — OXYTOCIN-SODIUM CHLORIDE 30-0.9 UT/500ML-% IV SOLN
INTRAVENOUS | Status: DC | PRN
  Administered 2022-08-03: 250 mL/h via INTRAVENOUS

## 2022-08-03 MED ORDER — DIPHENHYDRAMINE HCL 25 MG PO CAPS: 25.0000 mg | ORAL_CAPSULE | ORAL | Status: DC | PRN

## 2022-08-03 MED ORDER — KETOROLAC TROMETHAMINE 30 MG/ML IJ SOLN
INTRAMUSCULAR | Status: AC
  Filled 2022-08-03: qty 1

## 2022-08-03 MED ORDER — ONDANSETRON HCL 4 MG/2ML IJ SOLN
INTRAMUSCULAR | Status: DC | PRN
  Administered 2022-08-03: 4 mg via INTRAVENOUS

## 2022-08-03 MED ORDER — SIMETHICONE 80 MG PO CHEW: 80.0000 mg | CHEWABLE_TABLET | ORAL | Status: DC | PRN

## 2022-08-03 MED ORDER — KETOROLAC TROMETHAMINE 30 MG/ML IJ SOLN
30.0000 mg | Freq: Four times a day (QID) | INTRAMUSCULAR | Status: AC
  Administered 2022-08-03 – 2022-08-04 (×3): 30 mg via INTRAVENOUS
  Filled 2022-08-03 (×3): qty 1

## 2022-08-03 MED ORDER — ACETAMINOPHEN 500 MG PO TABS: 1000.0000 mg | ORAL_TABLET | Freq: Four times a day (QID) | ORAL | Status: DC

## 2022-08-03 MED ORDER — OXYTOCIN-SODIUM CHLORIDE 30-0.9 UT/500ML-% IV SOLN
INTRAVENOUS | Status: AC
  Filled 2022-08-03: qty 500

## 2022-08-03 MED ORDER — PRENATAL MULTIVITAMIN CH
1.0000 | ORAL_TABLET | Freq: Every day | ORAL | Status: DC
  Administered 2022-08-04 – 2022-08-06 (×3): 1 via ORAL
  Filled 2022-08-03 (×3): qty 1

## 2022-08-03 MED ORDER — SCOPOLAMINE 1 MG/3DAYS TD PT72
1.0000 | MEDICATED_PATCH | Freq: Once | TRANSDERMAL | Status: DC
  Administered 2022-08-03: 1.5 mg via TRANSDERMAL
  Filled 2022-08-03: qty 1

## 2022-08-03 MED ORDER — MORPHINE SULFATE (PF) 0.5 MG/ML IJ SOLN
INTRAMUSCULAR | Status: DC | PRN
  Administered 2022-08-03: 150 ug via INTRATHECAL

## 2022-08-03 MED ORDER — ONDANSETRON HCL 4 MG/2ML IJ SOLN
INTRAMUSCULAR | Status: AC
  Filled 2022-08-03: qty 2

## 2022-08-03 MED ORDER — CEFAZOLIN SODIUM-DEXTROSE 2-4 GM/100ML-% IV SOLN
2.0000 g | INTRAVENOUS | Status: AC
  Administered 2022-08-03: 2 g via INTRAVENOUS

## 2022-08-03 MED ORDER — PROMETHAZINE HCL 25 MG/ML IJ SOLN: 6.2500 mg | INTRAMUSCULAR | Status: DC | PRN

## 2022-08-03 MED ORDER — MEPERIDINE HCL 25 MG/ML IJ SOLN: 6.2500 mg | INTRAMUSCULAR | Status: DC | PRN

## 2022-08-03 MED ORDER — DIBUCAINE (PERIANAL) 1 % EX OINT: 1.0000 | TOPICAL_OINTMENT | CUTANEOUS | Status: DC | PRN

## 2022-08-03 MED ORDER — DEXAMETHASONE SODIUM PHOSPHATE 10 MG/ML IJ SOLN
INTRAMUSCULAR | Status: DC | PRN
  Administered 2022-08-03: 4 mg via INTRAVENOUS

## 2022-08-03 MED ORDER — DIPHENHYDRAMINE HCL 50 MG/ML IJ SOLN: 12.5000 mg | INTRAMUSCULAR | Status: DC | PRN

## 2022-08-03 MED ORDER — ONDANSETRON HCL 4 MG/2ML IJ SOLN
4.0000 mg | Freq: Three times a day (TID) | INTRAMUSCULAR | Status: DC | PRN
  Filled 2022-08-03: qty 2

## 2022-08-03 MED ORDER — OXYTOCIN-SODIUM CHLORIDE 30-0.9 UT/500ML-% IV SOLN
2.5000 [IU]/h | INTRAVENOUS | Status: AC
  Administered 2022-08-03: 2.5 [IU]/h via INTRAVENOUS

## 2022-08-03 MED ORDER — FENTANYL CITRATE (PF) 100 MCG/2ML IJ SOLN
INTRAMUSCULAR | Status: AC
  Filled 2022-08-03: qty 2

## 2022-08-03 MED ORDER — IBUPROFEN 600 MG PO TABS
600.0000 mg | ORAL_TABLET | Freq: Four times a day (QID) | ORAL | Status: DC
  Filled 2022-08-03: qty 1

## 2022-08-03 MED ORDER — OXYCODONE HCL 5 MG PO TABS
5.0000 mg | ORAL_TABLET | ORAL | Status: DC | PRN
  Administered 2022-08-05 – 2022-08-06 (×2): 5 mg via ORAL
  Filled 2022-08-03 (×2): qty 1

## 2022-08-03 MED ORDER — SODIUM CHLORIDE 0.9% FLUSH: 3.0000 mL | INTRAVENOUS | Status: DC | PRN

## 2022-08-03 MED ORDER — DIPHENHYDRAMINE HCL 25 MG PO CAPS: 25.0000 mg | ORAL_CAPSULE | Freq: Four times a day (QID) | ORAL | Status: DC | PRN

## 2022-08-03 MED ORDER — HYDROMORPHONE HCL 1 MG/ML IJ SOLN: 0.2500 mg | INTRAMUSCULAR | Status: DC | PRN

## 2022-08-03 MED ORDER — LACTATED RINGERS IV SOLN: INTRAVENOUS | Status: DC

## 2022-08-03 MED ORDER — PHENYLEPHRINE 80 MCG/ML (10ML) SYRINGE FOR IV PUSH (FOR BLOOD PRESSURE SUPPORT)
PREFILLED_SYRINGE | INTRAVENOUS | Status: AC
  Filled 2022-08-03: qty 10

## 2022-08-03 MED ORDER — KETOROLAC TROMETHAMINE 30 MG/ML IJ SOLN
30.0000 mg | Freq: Once | INTRAMUSCULAR | Status: AC | PRN
  Administered 2022-08-03: 30 mg via INTRAVENOUS

## 2022-08-03 MED ORDER — COCONUT OIL OIL: 1.0000 | TOPICAL_OIL | Status: DC | PRN

## 2022-08-03 MED ORDER — BUPIVACAINE IN DEXTROSE 0.75-8.25 % IT SOLN
INTRATHECAL | Status: DC | PRN
  Administered 2022-08-03: 1.3 mL via INTRATHECAL

## 2022-08-03 MED ORDER — SIMETHICONE 80 MG PO CHEW
80.0000 mg | CHEWABLE_TABLET | Freq: Three times a day (TID) | ORAL | Status: DC
  Administered 2022-08-03 – 2022-08-06 (×7): 80 mg via ORAL
  Filled 2022-08-03 (×7): qty 1

## 2022-08-03 MED ORDER — FENTANYL CITRATE (PF) 100 MCG/2ML IJ SOLN
INTRAMUSCULAR | Status: DC | PRN
  Administered 2022-08-03: 15 ug via INTRATHECAL

## 2022-08-03 MED ORDER — NALOXONE HCL 0.4 MG/ML IJ SOLN: 0.4000 mg | INTRAMUSCULAR | Status: DC | PRN

## 2022-08-03 MED ORDER — CEFAZOLIN SODIUM-DEXTROSE 2-4 GM/100ML-% IV SOLN
INTRAVENOUS | Status: AC
  Filled 2022-08-03: qty 100

## 2022-08-03 MED ORDER — POVIDONE-IODINE 10 % EX SWAB
2.0000 | Freq: Once | CUTANEOUS | Status: AC
  Administered 2022-08-03: 2 via TOPICAL

## 2022-08-03 MED ORDER — MORPHINE SULFATE (PF) 0.5 MG/ML IJ SOLN
INTRAMUSCULAR | Status: AC
  Filled 2022-08-03: qty 10

## 2022-08-03 MED ORDER — PHENYLEPHRINE HCL-NACL 20-0.9 MG/250ML-% IV SOLN
INTRAVENOUS | Status: DC | PRN
  Administered 2022-08-03: 40 ug/min via INTRAVENOUS

## 2022-08-03 SURGICAL SUPPLY — 36 items
BENZOIN TINCTURE PRP APPL 2/3 (GAUZE/BANDAGES/DRESSINGS) ×1 IMPLANT
CHLORAPREP W/TINT 26 (MISCELLANEOUS) ×2 IMPLANT
CLAMP CORD UMBIL (MISCELLANEOUS) ×1 IMPLANT
CLOSURE STERI-STRIP 1/4X4 (GAUZE/BANDAGES/DRESSINGS) IMPLANT
CLOTH BEACON ORANGE TIMEOUT ST (SAFETY) ×1 IMPLANT
DRSG OPSITE POSTOP 4X10 (GAUZE/BANDAGES/DRESSINGS) ×1 IMPLANT
ELECT REM PT RETURN 9FT ADLT (ELECTROSURGICAL) ×1
ELECTRODE REM PT RTRN 9FT ADLT (ELECTROSURGICAL) ×1 IMPLANT
EXTRACTOR VACUUM KIWI (MISCELLANEOUS) IMPLANT
GLOVE BIOGEL M STER SZ 6 (GLOVE) ×1 IMPLANT
GLOVE BIOGEL PI IND STRL 6 (GLOVE) ×1 IMPLANT
GLOVE BIOGEL PI IND STRL 7.0 (GLOVE) ×1 IMPLANT
GOWN STRL REUS W/TWL LRG LVL3 (GOWN DISPOSABLE) ×2 IMPLANT
KIT ABG SYR 3ML LUER SLIP (SYRINGE) IMPLANT
NDL HYPO 25X5/8 SAFETYGLIDE (NEEDLE) IMPLANT
NEEDLE HYPO 25X5/8 SAFETYGLIDE (NEEDLE) IMPLANT
NS IRRIG 1000ML POUR BTL (IV SOLUTION) ×1 IMPLANT
PACK C SECTION WH (CUSTOM PROCEDURE TRAY) ×1 IMPLANT
PAD OB MATERNITY 4.3X12.25 (PERSONAL CARE ITEMS) ×1 IMPLANT
RTRCTR C-SECT PINK 25CM LRG (MISCELLANEOUS) ×1 IMPLANT
STRIP CLOSURE SKIN 1/2X4 (GAUZE/BANDAGES/DRESSINGS) ×1 IMPLANT
SUT MNCRL 0 VIOLET CTX 36 (SUTURE) ×2 IMPLANT
SUT MNCRL AB 3-0 PS2 27 (SUTURE) ×1 IMPLANT
SUT MONOCRYL 0 CTX 36 (SUTURE) ×2
SUT PLAIN 0 NONE (SUTURE) IMPLANT
SUT PLAIN 2 0 (SUTURE) ×2
SUT PLAIN ABS 2-0 CT1 27XMFL (SUTURE) ×1 IMPLANT
SUT VIC AB 0 CTX 36 (SUTURE) ×2
SUT VIC AB 0 CTX36XBRD ANBCTRL (SUTURE) ×2 IMPLANT
SUT VIC AB 2-0 CT1 27 (SUTURE) ×1
SUT VIC AB 2-0 CT1 TAPERPNT 27 (SUTURE) ×1 IMPLANT
SUT VIC AB 4-0 PS2 27 (SUTURE) IMPLANT
SUT VICRYL 0 TIES 12 18 (SUTURE) IMPLANT
TOWEL OR 17X24 6PK STRL BLUE (TOWEL DISPOSABLE) ×1 IMPLANT
TRAY FOLEY W/BAG SLVR 14FR LF (SET/KITS/TRAYS/PACK) ×1 IMPLANT
WATER STERILE IRR 1000ML POUR (IV SOLUTION) ×1 IMPLANT

## 2022-08-03 NOTE — Op Note (Signed)
Cesarean Section Procedure Note  Pre-operative Diagnosis: 1. Intrauterine pregnancy at [redacted]w[redacted]d  2. History of cesarean section, decline trial of labor  3.  Desires permanent sterilization  Post-operative Diagnosis: same as above  Surgeon: Marlow Baars, MD  Assistants: Carrington Clamp, MD  Procedure: Repeat low transverse cesarean section with bilateral salpingectomy   Anesthesia: Spinal anesthesia  Estimated Blood Loss: 528 mL         Drains: Foley catheter         Specimens: bilateral fallopian tubes to pathology         Implants: none         Complications:  None; patient tolerated the procedure well.         Disposition: PACU - hemodynamically stable.  Findings:  Normal uterus, tubes and ovaries bilaterally.  Viable female infant, 3400g (7lb .9 oz) Apgars 9, 9.    Procedure Details   The patient was counseled about the risks, benefits, complications of the cesarean section as well as the permanent nature of a tubal ligation.  She elected to proceed with a tubal ligation. Consent was obtained.  The fetus was confirmed to be breech presentation with oligohydramnios in the preoperative holding.   After spinal anesthesia was found to adequate , the patient was placed in the dorsal supine position with a leftward tilt, prepped and draped in the usual sterile manner. A Pfannenstiel incision was made and carried down through the subcutaneous tissue to the fascia.  The fascia was incised in the midline and the fascial incision was extended laterally with Mayo scissors. The superior aspect of the fascial incision was grasped with two Kocher clamp, tented up and the rectus muscles dissected off bluntly. The rectus was then dissected off with blunt dissection and Mayo scissors inferiorly. The rectus muscles were separated in the midline. The abdominal peritoneum was identified, tented up, entered sharply, and the incision was extended superiorly and inferiorly with good visualization of the  bladder. The vesicouterine peritoneum was identified, tented up, entered bluntly.  The Alexis retractor was deployed and the bladder flap was created digitally. A scalpel was then used to make a low transverse incision on the uterus which was extended laterally with blunt dissection. The fluid was clear. The fetal vertex was identified and brought to the hysterotomy and delivered without difficulty.  After a standard 60 second delay, the cord was clamped and cut and the infant was passed to the waiting neonatologist.  Placenta was then delivered spontaneously, intact and appear normal, the uterus was cleared of all clot and debris.   The hysterotomy was repaired with #0 Monocryl in running locked fashion.  Hemostasis was noted.  At this time, attention was turned to the right fallopian tube.  The tube was grasped with two babcock clamps and avascular segments of the mesosalpinx was identified, opened with electrocautery to the level of the insertion of the tube in the uterine cornua.  The tube was then tied off with 0-vicryl and transected with electrocautery. Hemostasis was noted.  This was repeated on the left side.  There was some oozing at the site of the bladder flap that was controlled with cautery.  The hysterotomy was reexamined and excellent hemostasis was noted.  The abdominal cavity was cleared of all clot and debris.  The Alexis retractor was removed from the abdomen. The fascia and rectus muscles were inspected and were hemostatic.  The peritoneum was closed with 2-0 vicryl in a running fashion.  The rectus muscles were then  closed with 2-0 Vicryl. The fascia and rectus muscles were inspected and were hemostatic. The fascia was closed with 0 Vicryl in a running fashion. The subcutaneous layer was irrigated and all bleeders cauterized. The subcutaneous layer was closed with interrupted plain gut. The skin was closed with 4-0 vicryl in a subcuticular fashion. The incision was dressed with benzoine,  steri strips and honeycomb dressing.  A pressure dressing was then placed due to slight oozing of the subcutaneous layer. All sponge lap and needle counts were correct x3. Patient tolerated the procedure well and recovered in stable condition following the procedure.

## 2022-08-03 NOTE — Lactation Note (Deleted)
This note was copied from a baby's chart. Lactation Consultation Note   Maternal Data Has patient been taught Hand Expression?: Yes Does the patient have breastfeeding experience prior to this delivery?: Yes  Feeding Mother's Current Feeding Choice: Breast Milk  LATCH Score Latch: Repeated attempts needed to sustain latch, nipple held in mouth throughout feeding, stimulation needed to elicit sucking reflex.  Audible Swallowing: Spontaneous and intermittent  Type of Nipple: Everted at rest and after stimulation  Comfort (Breast/Nipple): Soft / non-tender  Hold (Positioning): Assistance needed to correctly position infant at breast and maintain latch.  LATCH Score: 8   Lactation Tools Discussed/Used Tools: Pump;Flanges Flange Size: 24 Breast pump type: Double-Electric Breast Pump Pump Education: Setup, frequency, and cleaning;Milk Storage Reason for Pumping: increase stimulation Pumping frequency: post pump after each feeding for 15 mins  Interventions Interventions: Breast feeding basics reviewed;Assisted with latch;Skin to skin;Breast massage;Hand express;Breast compression;Adjust position;Support pillows;Position options;Expressed milk;DEBP;Education;LC Services brochure;Infant Driven Feeding Algorithm education  Discharge Pump: DEBP;Personal WIC Program: Yes  Consult Status Consult Status: Follow-up Date: 08/04/22 Follow-up type: In-patient    Robyn Mccowen  [name] 08/03/2022, 4:48 PM

## 2022-08-03 NOTE — Anesthesia Postprocedure Evaluation (Signed)
Anesthesia Post Note  Patient: [employer] Null  Procedure(s) Performed: CESAREAN SECTION WITH BILATERAL SALPINGECTOMY (Bilateral)     Patient location during evaluation: PACU Anesthesia Type: Spinal Level of consciousness: awake Pain management: pain level controlled Vital Signs Assessment: post-procedure vital signs reviewed and stable Respiratory status: spontaneous breathing Cardiovascular status: stable Postop Assessment: no headache, no backache, spinal receding, patient able to bend at knees and no apparent nausea or vomiting Anesthetic complications: no   No notable events documented.  Last Vitals:  Vitals:   08/03/22 1415 08/03/22 1432  BP: 112/71 109/70  Pulse: 60 (!) 59  Resp: 13 16  Temp: (!) 36.4 C 36.5 C  SpO2: 100% 100%    Last Pain:  Vitals:   08/03/22 1432  TempSrc: Oral  PainSc:    Pain Goal:    LLE Motor Response: Purposeful movement (08/03/22 1415)   RLE Motor Response: Purposeful movement (08/03/22 1415)          Caren Macadam

## 2022-08-03 NOTE — H&P (Signed)
26 y.o. [id number] @ [redacted]w[redacted]d presents for repeat cesarean section.  Otherwise has good fetal movement and no bleeding.  Pregnancy complicated by: History of cesarean section for breech presentation of twin A with short interval pregnancy.  Declines trial of labor Fetal growth restriction: diagnosed at 34 weeks due to size < dates by AC< 10%.  EFW at 20%. Normal umbilical artery dopplers  Past Medical History:  Diagnosis Date   Asthma     Past Surgical History:  Procedure Laterality Date   CESAREAN SECTION MULTI-GESTATIONAL N/A 12/09/2020   Procedure: CESAREAN SECTION MULTI-GESTATIONAL;  Surgeon: Marlow Baars, MD;  Location: [facility] LD ORS;  Service: Obstetrics;  Laterality: N/A;   WISDOM TOOTH EXTRACTION     WISDOM TOOTH EXTRACTION N/A 2012    OB History  Gravida Para Term Preterm AB Living  2 1   1   2   SAB IAB Ectopic Multiple Live Births        1 2    # Outcome Date GA Lbr Len/2nd Weight Sex Delivery Anes PTL Lv  2 Current           1A Preterm 12/09/20 [redacted]w[redacted]d  2860 g M CS-LTranv Spinal  LIV  1B Preterm 12/09/20 [redacted]w[redacted]d  2470 g M CS-LTranv Spinal  LIV    Social History   Socioeconomic History   Marital status: Married    Spouse name: Not on file   Number of children: Not on file   Years of education: Not on file   Highest education level: Not on file  Occupational History   Not on file  Tobacco Use   Smoking status: Never   Smokeless tobacco: Never  Vaping Use   Vaping Use: Never used  Substance and Sexual Activity   Alcohol use: Not Currently    Comment: ccasionally   Drug use: Never   Sexual activity: Not Currently  Other Topics Concern   Not on file  Social History Narrative   Not on file   Social Determinants of Health   Financial Resource Strain: Not on file  Food Insecurity: Not on file  Transportation Needs: Not on file  Physical Activity: Not on file  Stress: Not on file  Social Connections: Not on file  Intimate Partner Violence: Not on file   Patient has  no known allergies.    Prenatal Transfer Tool  Maternal Diabetes: No Genetic Screening: Normal Maternal Ultrasounds/Referrals: IUGR by AC< 10%, see above Fetal Ultrasounds or other Referrals:  None Maternal Substance Abuse:  No Significant Maternal Medications:  None Significant Maternal Lab Results: Rh negative  ABO, Rh: --/--/A POS (09/05 1048) Antibody: NEG (09/05 1048) Rubella: Immune (03/07 0000) RPR: NON REACTIVE (09/05 1040)  HBsAg: Negative (03/07 0000)  HIV: Non-reactive (03/07 0000)  GBS:   positive by urine in first trimester   Vitals:   08/03/22 1036  BP: 117/76  Pulse: 76  Resp: 18  Temp: 97.8 F (36.6 C)  SpO2: 100%     General:  NAD Abdomen:  soft, gravid Ext:  +SCDs FHTs:  145    A/P   26 y.o. 34 [redacted]w[redacted]d presents for repeat cesarean section and bilateral salpingectomy.    Discussed risks of cesarean section to include, but not limited to, infection, bleeding, damage to surrounding strutcures (including bowel, bladder, tubes, ovaries, nerves, vessels, baby), need for additional procedures, risk of blood clot, need for transfusion.  We discussed the permanent nature of sterilization, risk of regret, risk of failure, resulting in pregnancy, including ectopic  pregnancy.  Consent signed Ancef 2gm on call to OR  [facility] GEFFEL Chestine Spore

## 2022-08-03 NOTE — Lactation Note (Signed)
This note was copied from a baby's chart. Lactation Consultation Note  Patient Name: Robyn [name] [id number] Date: 08/03/2022 Reason for consult: Initial assessment;Term;Breastfeeding assistance (Baby STS on moms chest, per Birth parent had last fed around 1:30p . LC offered to check the diaper - wet/ stool. baby more awake,placed STS and attempted to feed with few sucks and off. Baby didn't seem interested. LC enc to call with more feeding cues.) Age:27 hours  Maternal Data Has patient been taught Hand Expression?:  (per Birth parent - several breast changes this pregnancy) Does the patient have breastfeeding experience prior to this delivery?: Yes How long did the patient breastfeed?: per mom 1st babies set a twins , alot earlier and smaller. BF in the hospital and at home switched to formula.  Feeding Mother's Current Feeding Choice: Breast Milk  LATCH Score Latch: Repeated attempts needed to sustain latch, nipple held in mouth throughout feeding, stimulation needed to elicit sucking reflex.  Audible Swallowing: None  Type of Nipple: Everted at rest and after stimulation  Comfort (Breast/Nipple): Soft / non-tender  Hold (Positioning): Full assist, staff holds infant at breast  LATCH Score: 5   Lactation Tools Discussed/Used    Interventions Interventions: Breast feeding basics reviewed;Assisted with latch;Skin to skin;Breast massage;Breast compression;Adjust position;Support pillows;Position options;LC Services brochure;Education  Discharge    Consult Status Consult Status: Follow-up Date: 08/03/22 Follow-up type: In-patient    Robyn [name] 08/03/2022, 5:53 PM

## 2022-08-03 NOTE — Anesthesia Preprocedure Evaluation (Signed)
Anesthesia Evaluation  Patient identified by MRN, date of birth, ID band Patient awake    Reviewed: Allergy & Precautions, H&P , NPO status , Patient's Chart, lab work & pertinent test results  Airway Mallampati: I       Dental no notable dental hx.    Pulmonary asthma ,    Pulmonary exam normal        Cardiovascular negative cardio ROS Normal cardiovascular exam     Neuro/Psych negative neurological ROS  negative psych ROS   GI/Hepatic negative GI ROS, Neg liver ROS,   Endo/Other  negative endocrine ROS  Renal/GU negative Renal ROS  negative genitourinary   Musculoskeletal negative musculoskeletal ROS (+)   Abdominal Normal abdominal exam  (+)   Peds negative pediatric ROS (+)  Hematology negative hematology ROS (+)   Anesthesia Other Findings   Reproductive/Obstetrics (+) Pregnancy                             Anesthesia Physical  Anesthesia Plan  ASA: II  Anesthesia Plan: Spinal   Post-op Pain Management:    Induction:   PONV Risk Score and Plan: 3 and Scopolamine patch - Pre-op, Ondansetron, Dexamethasone and Treatment may vary due to age or medical condition  Airway Management Planned: Natural Airway, Simple Face Mask and Nasal Cannula  Additional Equipment: None  Intra-op Plan:   Post-operative Plan:   Informed Consent: I have reviewed the patients History and Physical, chart, labs and discussed the procedure including the risks, benefits and alternatives for the proposed anesthesia with the patient or authorized representative who has indicated his/her understanding and acceptance.       Plan Discussed with: CRNA  Anesthesia Plan Comments:         Anesthesia Quick Evaluation

## 2022-08-03 NOTE — Transfer of Care (Signed)
Immediate Anesthesia Transfer of Care Note  Patient: Robyn [name]  Procedure(s) Performed: CESAREAN SECTION WITH BILATERAL SALPINGECTOMY (Bilateral)  Patient Location: PACU  Anesthesia Type:Spinal  Level of Consciousness: awake, alert  and oriented  Airway & Oxygen Therapy: Patient Spontanous Breathing  Post-op Assessment: Report given to RN and Post -op Vital signs reviewed and stable  Post vital signs: Reviewed and stable  Last Vitals:  Vitals Value Taken Time  BP 100/66 08/03/22 1318  Temp    Pulse 35 08/03/22 1319  Resp 33 08/03/22 1319  SpO2 78 % 08/03/22 1319  Vitals shown include unvalidated device data.  Last Pain:  Vitals:   08/03/22 1036  TempSrc: Oral         Complications: No notable events documented.

## 2022-08-03 NOTE — Anesthesia Procedure Notes (Signed)
Spinal  Patient location during procedure: OR Start time: 08/03/2022 11:59 AM End time: 08/03/2022 12:01 PM Reason for block: surgical anesthesia Staffing Performed: anesthesiologist  Anesthesiologist: Leilani Able, MD Performed by: Leilani Able, MD Authorized by: Leilani Able, MD   Preanesthetic Checklist Completed: patient identified, IV checked, site marked, risks and benefits discussed, surgical consent, monitors and equipment checked, pre-op evaluation and timeout performed Spinal Block Patient position: sitting Prep: DuraPrep and site prepped and draped Patient monitoring: continuous pulse ox and blood pressure Approach: midline Location: L3-4 Injection technique: single-shot Needle Needle type: Pencan  Needle gauge: 24 G Needle length: 10 cm Needle insertion depth: 5 cm Assessment Sensory level: T4 Events: CSF return

## 2022-08-04 ENCOUNTER — Encounter (HOSPITAL_COMMUNITY): Payer: Self-pay | Admitting: Obstetrics

## 2022-08-04 LAB — CBC
HCT: 31.7 % — ABNORMAL LOW (ref 36.0–46.0)
Hemoglobin: 11 g/dL — ABNORMAL LOW (ref 12.0–15.0)
MCH: 32.3 pg (ref 26.0–34.0)
MCHC: 34.7 g/dL (ref 30.0–36.0)
MCV: 93 fL (ref 80.0–100.0)
Platelets: 252 10*3/uL (ref 150–400)
RBC: 3.41 MIL/uL — ABNORMAL LOW (ref 3.87–5.11)
RDW: 14.6 % (ref 11.5–15.5)
WBC: 13.2 10*3/uL — ABNORMAL HIGH (ref 4.0–10.5)
nRBC: 0 % (ref 0.0–0.2)

## 2022-08-04 LAB — BIRTH TISSUE RECOVERY COLLECTION (PLACENTA DONATION)

## 2022-08-04 MED ORDER — IBUPROFEN 600 MG PO TABS
600.0000 mg | ORAL_TABLET | Freq: Four times a day (QID) | ORAL | Status: DC
  Administered 2022-08-04 – 2022-08-06 (×8): 600 mg via ORAL
  Filled 2022-08-04 (×8): qty 1

## 2022-08-04 MED ORDER — OXYCODONE HCL 5 MG PO TABS: 5.0000 mg | ORAL_TABLET | Freq: Four times a day (QID) | ORAL | 0 refills | Status: DC | PRN

## 2022-08-04 NOTE — Lactation Note (Signed)
This note was copied from a baby's chart. Lactation Consultation Note  Patient Name: Robyn [name] OVFIE'P Date: 08/04/2022 Reason for consult: Follow-up assessment;Mother's request;Term;Breastfeeding assistance Age:27 hours  Birth parent states infant feeding well. At times latch painful to start. Birth parent recent feeding. Will call for latch check for next feeding.   Infant adequate output. All questions answered at the end of the visit.   Maternal Data Has patient been taught Hand Expression?: Yes Does the patient have breastfeeding experience prior to this delivery?: Yes How long did the patient breastfeed?: twins in NICU attempted to latch moslty pumped and bottle fed  Feeding Mother's Current Feeding Choice: Breast Milk  LATCH Score                    Lactation Tools Discussed/Used    Interventions Interventions: Breast feeding basics reviewed;Assisted with latch;Skin to skin;Breast massage;Hand express;Breast compression;Position options;Expressed milk;Education;Visual merchandiser education  Discharge    Consult Status Consult Status: Follow-up Date: 08/05/22 Follow-up type: In-patient    Robyn [name]  [name] 08/04/2022, 2:34 PM

## 2022-08-04 NOTE — Progress Notes (Signed)
Subjective: Postpartum Day #1: Cesarean Delivery Patient reports incisional pain, tolerating PO, and no problems voiding.    Objective: Vital signs in last 24 hours: Temp:  [97.5 F (36.4 C)-98.3 F (36.8 C)] 98 F (36.7 C) (09/08 0071) Pulse Rate:  [55-76] 58 (09/08 0613) Resp:  [13-33] 16 (09/08 0613) BP: (94-117)/(59-76) 107/70 (09/08 0613) SpO2:  [97 %-100 %] 99 % (09/08 0613) Weight:  [74.8 kg] 74.8 kg (09/07 1036)  Physical Exam:  General: alert Lochia: appropriate Uterine Fundus: firm Incision: pressure dressing intact  Recent Labs    08/01/22 1040 08/04/22 0519  HGB 12.2 11.0*  HCT 35.3* 31.7*    Assessment/Plan: Status post Cesarean section. Doing well postoperatively.  Continue current care, ambulate.  Zenaida Niece, MD 08/04/2022, 9:39 AM

## 2022-08-04 NOTE — Discharge Summary (Signed)
Postpartum Discharge Summary  Date of Service updated      Patient Name: Robyn [name] DOB: May 16, 1996 MRN: [id number]  Date of admission: 08/03/2022 Delivery date:08/03/2022  Delivering provider: [name]  Date of discharge: 08/05/2022  Admitting diagnosis: History of cesarean delivery [Z98.891] Intrauterine pregnancy: [redacted]w[redacted]d    Secondary diagnosis:  Principal Problem:   History of cesarean delivery  Additional problems: IUGR    Discharge diagnosis: Term Pregnancy Delivered                                              Post partum procedures: none Augmentation: N/A Complications: None  Hospital course: Sceduled C/S   26y.o. yo G2P1103 at 347w2das admitted to the hospital 08/03/2022 for scheduled cesarean section with the following indication:Prior Uterine Surgery and IUGR .Delivery details are as follows:  Membrane Rupture Time/Date: 12:24 PM ,08/03/2022   Delivery Method:C-Section, Low Transverse  Details of operation can be found in separate operative note.  Patient had an uncomplicated postpartum course.  She is ambulating, tolerating a regular diet, passing flatus, and urinating well. Patient is discharged home in stable condition on  08/05/22        Newborn Data: Birth date:08/03/2022  Birth time:12:25 PM  Gender:Female  Living status:Living  Apgars:9 ,9  Weight:3400 g     Magnesium Sulfate received: No BMZ received: No Rhophylac:No MMR:No T-DaP:Given prenatally Flu: No Transfusion:No  Physical exam  Vitals:   08/04/22 0217 08/04/22 0613 08/04/22 2036 08/05/22 0500  BP: (!) 94/59 107/70 111/77 108/71  Pulse:  (!) 58 68 67  Resp: 16 16 18 18   Temp: 98.2 F (36.8 C) 98 F (36.7 C) 98.3 F (36.8 C) 97.7 F (36.5 C)  TempSrc: Oral  Oral Oral  SpO2: 99% 99%    Weight:      Height:       Labs: Lab Results  Component Value Date   WBC 13.2 (H) 08/04/2022   HGB 11.0 (L) 08/04/2022   HCT 31.7 (L) 08/04/2022   MCV 93.0 08/04/2022   PLT 252 08/04/2022       Latest Ref Rng & Units 12/09/2020    9:44 PM  CMP  Glucose 70 - 99 mg/dL 83   BUN 6 - 20 mg/dL 5   Creatinine 0.44 - 1.00 mg/dL 0.61   Sodium 135 - 145 mmol/L 130   Potassium 3.5 - 5.1 mmol/L 4.0   Chloride 98 - 111 mmol/L 99   CO2 22 - 32 mmol/L 19   Calcium 8.9 - 10.3 mg/dL 8.8   Total Protein 6.5 - 8.1 g/dL 5.8   Total Bilirubin 0.3 - 1.2 mg/dL 0.6   Alkaline Phos 38 - 126 U/L 168   AST 15 - 41 U/L 23   ALT 0 - 44 U/L 14    Edinburgh Score:    08/03/2022    6:37 PM  Edinburgh Postnatal Depression Scale Screening Tool  I have been able to laugh and see the funny side of things. 0  I have looked forward with enjoyment to things. 0  I have blamed myself unnecessarily when things went wrong. 1  I have been anxious or worried for no good reason. 0  I have felt scared or panicky for no good reason. 0  Things have been getting on top of me. 1  I have been  so unhappy that I have had difficulty sleeping. 0  I have felt sad or miserable. 0  I have been so unhappy that I have been crying. 0  The thought of harming myself has occurred to me. 0  Edinburgh Postnatal Depression Scale Total 2      After visit meds:  Allergies as of 08/05/2022   No Known Allergies      Medication List     TAKE these medications    acetaminophen 500 MG tablet Commonly known as: TYLENOL Take 2 tablets (1,000 mg total) by mouth every 6 (six) hours as needed for headache.   calcium carbonate 500 MG chewable tablet Commonly known as: TUMS - dosed in mg elemental calcium Chew 1-2 tablets by mouth 3 (three) times daily as needed for indigestion or heartburn.   diphenhydrAMINE 2 % cream Commonly known as: BENADRYL Apply 1 Application topically daily as needed (skin irritation.).   famotidine 20 MG tablet Commonly known as: PEPCID Take 20 mg by mouth in the morning.   ibuprofen 600 MG tablet Commonly known as: ADVIL Take 1 tablet (600 mg total) by mouth every 6 (six) hours.   oxyCODONE 5 MG  immediate release tablet Commonly known as: Oxy IR/ROXICODONE Take 1 tablet (5 mg total) by mouth every 6 (six) hours as needed for severe pain.   PRENATAL + DHA PO Take 1 tablet by mouth in the morning.   sodium chloride 0.65 % Soln nasal spray Commonly known as: OCEAN Place 1 spray into both nostrils as needed for congestion.               Discharge Care Instructions  (From admission, onward)           Start     Ordered   08/05/22 0000  Discharge wound care:       Comments: Sitz baths and icepacks to perineum.  If stitches, they will dissolve.   08/05/22 2449             Discharge home in stable condition Infant Feeding:  ? Infant Disposition:home with mother Discharge instruction: per After Visit Summary and Postpartum booklet. Activity: Advance as tolerated. Pelvic rest for 6 weeks.  Diet: routine diet Anticipated Birth Control:  salpingectomies done Postpartum Appointment:4 weeks Additional Postpartum F/U:  none Future Appointments:No future appointments. Follow up Visit:  Follow-up Information     [name], MD Follow up in 4 day(s).   Specialty: Obstetrics Contact information: [address] [place] [address] [phone]                     08/05/2022 Daria Pastures, MD

## 2022-08-05 MED ORDER — IBUPROFEN 600 MG PO TABS: 600.0000 mg | ORAL_TABLET | Freq: Four times a day (QID) | ORAL | 0 refills | Status: AC

## 2022-08-05 NOTE — Lactation Note (Addendum)
This note was copied from a baby's chart. Lactation Consultation Note  Patient Name: Boy Amen Dargis LOVFI'E Date: 08/05/2022 Reason for consult: Follow-up assessment;Infant weight loss;Breastfeeding assistance;Term (10.29% WL) Age:27 hours  P2, Term, Infant Female  LC entered the room and baby was at the breast.  The birth parent states that he was at the end of the feeding.  LC spoke with the parents about baby's weight loss and asked if they were open to supplementing.  The parents consented to donor breast milk and signed the consent.  Per the birth parent, she used donor milk with her twins.  LC gave the birth parent 3 bottles with 15 mL.  Baby took 42mL of donor milk and 83mL of the birth parent's expressed milk.  Baby had a small emesis episode.  If baby is cluster feeding supplement with every q3hrs with at least 18-61mL.  All questions were answered.   Current Feeding Plan: Keep feeding to 30 min total.  Breastfeed for 15 min according to feeding cues.  Supplement with donor breast milk or formula according to the supplementation guidelines.  Pump q3hrs.  Call RN/LC for assistance with breastfeeding.   Maternal Data    Feeding Mother's Current Feeding Choice: Breast Milk and Donor Milk Nipple Type: Nfant Slow Flow (purple)   Interventions Interventions: Education;Breast feeding basics reviewed  Discharge    Consult Status Consult Status: Follow-up Date: 08/06/22 Follow-up type: In-patient    Delene Loll 08/05/2022, 6:16 PM

## 2022-08-05 NOTE — Progress Notes (Signed)
  Patient is eating, ambulating, voiding.  Pain control is good.  Vitals:   08/04/22 0217 08/04/22 0613 08/04/22 2036 08/05/22 0500  BP: (!) 94/59 107/70 111/77 108/71  Pulse:  (!) 58 68 67  Resp: 16 16 18 18   Temp: 98.2 F (36.8 C) 98 F (36.7 C) 98.3 F (36.8 C) 97.7 F (36.5 C)  TempSrc: Oral  Oral Oral  SpO2: 99% 99%    Weight:      Height:        lungs:   clear to auscultation cor:    RRR Abdomen:  soft, appropriate tenderness, incisions intact and without erythema or exudate ex:    no cords   Lab Results  Component Value Date   WBC 13.2 (H) 08/04/2022   HGB 11.0 (L) 08/04/2022   HCT 31.7 (L) 08/04/2022   MCV 93.0 08/04/2022   PLT 252 08/04/2022    --/--/A POS (09/05 1048)/RI  A/P    Post operative day 2.  Routine post op and postpartum care.  Expect d/c today.  Oxycodone for pain control.

## 2022-08-06 NOTE — Lactation Note (Signed)
This note was copied from a baby's chart. Lactation Consultation Note  Patient Name: Robyn [name] FBPZW'C Date: 08/06/2022 Reason for consult: Follow-up assessment;Infant weight loss;Term (Per Birth parent the last feeding was at 9 amd from the bottle and weight has come back. Birth parent was asking how to get the baby back to the breast if he has gotten use to the bottle.) Age:27 hours LC reviewed BF D/C teaching and LC resources after D/C.  Dad rented a DEBP from the gift shop.  LC recommended until the 1st weight check at the [facility] office tomorrow.  Feed with feeding cues and by 3 hours ( 8-12 times day )  Fed the baby on the 1st breast 15 -20 mins , supplement 30 ml and post pump both breast, next feeding switch to the other breast and do the same.  If baby is having a difficult time latching - give the baby an appetizer of EBM 10 ml and then latch quickly. If still struggling to latch, feed the supplement 1st  and then the breast.  Mom aware of the [facility] O/P if needed.  Maternal Data Has patient been taught Hand Expression?: Yes  Feeding Mother's Current Feeding Choice: Breast Milk and Donor Milk Nipple Type: Nfant Standard Flow (white)  LATCH Score  Lactation Tools Discussed/Used Tools: Pump Breast pump type: Double-Electric Breast Pump Reason for Pumping: due to significant weight loss, per Birth parent last pumping was 12 ml . Pumped volume: 12 mL  Interventions    Discharge Discharge Education: Engorgement and breast care;Warning signs for feeding baby Pump: Rented;DEBP  Consult Status Consult Status: Complete Date: 08/06/22    Kathrin Greathouse 08/06/2022, 12:20 PM

## 2022-08-06 NOTE — Progress Notes (Signed)
  Patient is eating, ambulating, voiding.  Pain control is good.  Vitals:   08/05/22 0500 08/05/22 1500 08/05/22 1944 08/06/22 0553  BP: 108/71 107/64 120/64 108/69  Pulse: 67 64  62  Resp: 18 18 18 18   Temp: 97.7 F (36.5 C) 97.8 F (36.6 C) 98.1 F (36.7 C) 97.8 F (36.6 C)  TempSrc: Oral Oral Oral Oral  SpO2:      Weight:      Height:        lungs:   clear to auscultation cor:    RRR Abdomen:  soft, appropriate tenderness, incisions intact and without erythema or exudate ex:    no cords   Lab Results  Component Value Date   WBC 13.2 (H) 08/04/2022   HGB 11.0 (L) 08/04/2022   HCT 31.7 (L) 08/04/2022   MCV 93.0 08/04/2022   PLT 252 08/04/2022    --/--/A POS (09/05 1048)/RI  A/P    Post operative day 3.  BAby stayed so no d/c yesterday.  Can sign out room if baby still pt today.  Routine post op and postpartum care.  Expect d/c today.  Oxycodone for pain control.

## 2022-08-07 LAB — SURGICAL PATHOLOGY

## 2022-08-10 DIAGNOSIS — R300 Dysuria: Secondary | ICD-10-CM | POA: Diagnosis not present

## 2022-08-11 ENCOUNTER — Telehealth (HOSPITAL_COMMUNITY): Payer: Self-pay | Admitting: *Deleted

## 2022-08-11 NOTE — Telephone Encounter (Signed)
Mom reports feeling good. Incision healing well per mom. No concerns regarding herself at this time. EPDS=1 (hospital score=2) Mom reports baby is well. Feeding, peeing, and pooping without difficulty. Reviewed safe sleep. Mom has no concerns about baby at present.  Duffy Rhody, RN 08-11-2022 at 11:32am

## 2022-09-06 DIAGNOSIS — Z124 Encounter for screening for malignant neoplasm of cervix: Secondary | ICD-10-CM | POA: Diagnosis not present

## 2022-09-15 DIAGNOSIS — J4521 Mild intermittent asthma with (acute) exacerbation: Secondary | ICD-10-CM | POA: Diagnosis not present

## 2022-09-15 DIAGNOSIS — J069 Acute upper respiratory infection, unspecified: Secondary | ICD-10-CM | POA: Diagnosis not present

## 2022-09-21 ENCOUNTER — Encounter (HOSPITAL_COMMUNITY): Payer: Self-pay | Admitting: Obstetrics

## 2022-09-22 DIAGNOSIS — J0100 Acute maxillary sinusitis, unspecified: Secondary | ICD-10-CM | POA: Diagnosis not present

## 2022-09-22 DIAGNOSIS — B9689 Other specified bacterial agents as the cause of diseases classified elsewhere: Secondary | ICD-10-CM | POA: Diagnosis not present

## 2023-09-13 DIAGNOSIS — N9411 Superficial (introital) dyspareunia: Secondary | ICD-10-CM | POA: Diagnosis not present

## 2023-09-13 DIAGNOSIS — Z01411 Encounter for gynecological examination (general) (routine) with abnormal findings: Secondary | ICD-10-CM | POA: Diagnosis not present

## 2023-09-14 ENCOUNTER — Encounter: Payer: Self-pay | Admitting: Family Medicine

## 2024-02-06 NOTE — Progress Notes (Unsigned)
 New patient visit   Patient: Robyn [name]   DOB: 1996/03/04   28 y.o. Female  MRN: [id number] Visit Date: 02/07/2024  Today's healthcare provider: Alfredia Ferguson, PA-C   No chief complaint on file.  Subjective    Robyn [name] is a 28 y.o. female who presents today as a new patient to establish care.   ***  Past Medical History:  Diagnosis Date   Asthma    Past Surgical History:  Procedure Laterality Date   CESAREAN SECTION MULTI-GESTATIONAL N/A 12/09/2020   Procedure: CESAREAN SECTION MULTI-GESTATIONAL;  Surgeon: Marlow Baars, MD;  Location: [facility] LD ORS;  Service: Obstetrics;  Laterality: N/A;   CESAREAN SECTION WITH BILATERAL TUBAL LIGATION Bilateral 08/03/2022   Procedure: CESAREAN SECTION WITH BILATERAL SALPINGECTOMY;  Surgeon: Marlow Baars, MD;  Location: [facility] LD ORS;  Service: Obstetrics;  Laterality: Bilateral;   WISDOM TOOTH EXTRACTION     WISDOM TOOTH EXTRACTION N/A 2012   Family Status  Relation Name Status   Mother  (Not Specified)   Emelda Brothers  (Not Specified)   MGM  (Not Specified)   PGM  (Not Specified)  No partnership data on file   Family History  Problem Relation Age of Onset   Asthma Mother    Breast cancer Paternal Aunt    Cancer Maternal Grandmother        endometrial   Breast cancer Paternal Grandmother    Social History   Socioeconomic History   Marital status: Married    Spouse name: Not on file   Number of children: Not on file   Years of education: Not on file   Highest education level: Not on file  Occupational History   Not on file  Tobacco Use   Smoking status: Never   Smokeless tobacco: Never  Vaping Use   Vaping status: Never Used  Substance and Sexual Activity   Alcohol use: Not Currently    Comment: ccasionally   Drug use: Never   Sexual activity: Not Currently  Other Topics Concern   Not on file  Social History Narrative   Not on file   Social Drivers of Health   Financial Resource Strain: Not on file  Food  Insecurity: Not on file  Transportation Needs: Not on file  Physical Activity: Not on file  Stress: Not on file  Social Connections: Unknown (09/15/2022)   Received from [facility]   Social Network    Social Network: Not on file   Outpatient Medications Prior to Visit  Medication Sig   acetaminophen (TYLENOL) 500 MG tablet Take 2 tablets (1,000 mg total) by mouth every 6 (six) hours as needed for headache.   calcium carbonate (TUMS - DOSED IN MG ELEMENTAL CALCIUM) 500 MG chewable tablet Chew 1-2 tablets by mouth 3 (three) times daily as needed for indigestion or heartburn.   diphenhydrAMINE (BENADRYL) 2 % cream Apply 1 Application topically daily as needed (skin irritation.).   famotidine (PEPCID) 20 MG tablet Take 20 mg by mouth in the morning.   ibuprofen (ADVIL) 600 MG tablet Take 1 tablet (600 mg total) by mouth every 6 (six) hours.   oxyCODONE (OXY IR/ROXICODONE) 5 MG immediate release tablet Take 1 tablet (5 mg total) by mouth every 6 (six) hours as needed for severe pain.   Prenatal-FeFum-FA-DHA w/o A (PRENATAL + DHA PO) Take 1 tablet by mouth in the morning.   sodium chloride (OCEAN) 0.65 % SOLN nasal spray Place 1 spray into both nostrils as needed  for congestion.   No facility-administered medications prior to visit.   No Known Allergies  Immunization History  Administered Date(s) Administered   Influenza Inj Mdck Quad Pf 08/19/2020   Influenza,inj,Quad PF,6+ Mos 07/25/2019   Influenza-Unspecified 08/27/2021   PFIZER(Purple Top)SARS-COV-2 Vaccination 02/28/2020, 03/23/2020   Tdap 05/10/2022    Health Maintenance  Topic Date Due   Pneumococcal Vaccine 58-64 Years old (1 of 2 - PCV) Never done   INFLUENZA VACCINE  06/28/2023   COVID-19 Vaccine (3 - 2024-25 season) 07/29/2023   Cervical Cancer Screening (Pap smear)  05/10/2025   DTaP/Tdap/Td (2 - Td or Tdap) 05/10/2032   Hepatitis C Screening  Completed   HIV Screening  Completed   HPV VACCINES  Aged  Out    Patient Care Team: Loyola Mast, MD as PCP - General (Family Medicine)  Review of Systems  {Insert previous labs (optional):23779} {See past labs  Heme  Chem  Endocrine  Serology  Results Review (optional):1}   Objective    There were no vitals taken for this visit. {Insert last BP/Wt (optional):23777}{See vitals history (optional):1}   Physical Exam ***  Depression Screen    06/21/2021   11:21 AM  PHQ 2/9 Scores  PHQ - 2 Score 0   No results found for any visits on 02/07/24.  Assessment & Plan     There are no diagnoses linked to this encounter.   No follow-ups on file.      Alfredia Ferguson, PA-C  [facility] Primary [facility] at [facility] [phone] (phone) [phone] (fax)  [facility]

## 2024-02-07 ENCOUNTER — Ambulatory Visit (HOSPITAL_BASED_OUTPATIENT_CLINIC_OR_DEPARTMENT_OTHER)
Admission: RE | Admit: 2024-02-07 | Discharge: 2024-02-07 | Disposition: A | Discharge status: Home / Self Care | Source: Ambulatory Visit | Attending: Physician Assistant | Admitting: Physician Assistant

## 2024-02-07 ENCOUNTER — Ambulatory Visit (INDEPENDENT_AMBULATORY_CARE_PROVIDER_SITE_OTHER): Admitting: Physician Assistant

## 2024-02-07 VITALS — BP 131/81 | HR 74 | Ht 65.0 in | Wt 147.4 lb

## 2024-02-07 DIAGNOSIS — M545 Low back pain, unspecified: Secondary | ICD-10-CM | POA: Diagnosis not present

## 2024-02-07 DIAGNOSIS — R519 Headache, unspecified: Secondary | ICD-10-CM | POA: Diagnosis not present

## 2024-02-07 DIAGNOSIS — G4486 Cervicogenic headache: Secondary | ICD-10-CM | POA: Diagnosis not present

## 2024-02-07 DIAGNOSIS — G8929 Other chronic pain: Secondary | ICD-10-CM | POA: Diagnosis not present

## 2024-02-07 NOTE — Addendum Note (Signed)
 Addended by: Mertha Finders on: 02/07/2024 04:54 PM   Modules accepted: Orders

## 2024-02-08 ENCOUNTER — Encounter: Payer: Self-pay | Admitting: Physician Assistant

## 2024-02-08 ENCOUNTER — Other Ambulatory Visit (INDEPENDENT_AMBULATORY_CARE_PROVIDER_SITE_OTHER)

## 2024-02-08 DIAGNOSIS — G4486 Cervicogenic headache: Secondary | ICD-10-CM | POA: Diagnosis not present

## 2024-02-08 LAB — CBC WITH DIFFERENTIAL/PLATELET
Basophils Absolute: 0 10*3/uL (ref 0.0–0.1)
Basophils Relative: 0.4 % (ref 0.0–3.0)
Eosinophils Absolute: 0.1 10*3/uL (ref 0.0–0.7)
Eosinophils Relative: 1.8 % (ref 0.0–5.0)
HCT: 39.8 % (ref 36.0–46.0)
Hemoglobin: 13.4 g/dL (ref 12.0–15.0)
Lymphocytes Relative: 32 % (ref 12.0–46.0)
Lymphs Abs: 2.7 10*3/uL (ref 0.7–4.0)
MCHC: 33.8 g/dL (ref 30.0–36.0)
MCV: 89.1 fl (ref 78.0–100.0)
Monocytes Absolute: 0.6 10*3/uL (ref 0.1–1.0)
Monocytes Relative: 6.5 % (ref 3.0–12.0)
Neutro Abs: 5 10*3/uL (ref 1.4–7.7)
Neutrophils Relative %: 59.3 % (ref 43.0–77.0)
Platelets: 368 10*3/uL (ref 150.0–400.0)
RBC: 4.46 Mil/uL (ref 3.87–5.11)
RDW: 14 % (ref 11.5–15.5)
WBC: 8.5 10*3/uL (ref 4.0–10.5)

## 2024-02-08 LAB — COMPREHENSIVE METABOLIC PANEL
ALT: 9 U/L (ref 0–35)
AST: 11 U/L (ref 0–37)
Albumin: 4.6 g/dL (ref 3.5–5.2)
Alkaline Phosphatase: 58 U/L (ref 39–117)
BUN: 11 mg/dL (ref 6–23)
CO2: 26 meq/L (ref 19–32)
Calcium: 9.6 mg/dL (ref 8.4–10.5)
Chloride: 104 meq/L (ref 96–112)
Creatinine, Ser: 0.69 mg/dL (ref 0.40–1.20)
GFR: 117.49 mL/min (ref >60.00)
Glucose, Bld: 91 mg/dL (ref 70–99)
Potassium: 4.8 meq/L (ref 3.5–5.1)
Sodium: 139 meq/L (ref 135–145)
Total Bilirubin: 0.6 mg/dL (ref 0.2–1.2)
Total Protein: 7.2 g/dL (ref 6.0–8.3)

## 2024-02-08 LAB — VITAMIN B12: Vitamin B-12: 267 pg/mL (ref 211–911)

## 2024-02-08 LAB — TSH: TSH: 0.71 u[IU]/mL (ref 0.35–5.50)

## 2024-02-22 ENCOUNTER — Encounter: Payer: Self-pay | Admitting: Physician Assistant

## 2024-09-05 ENCOUNTER — Ambulatory Visit: Admitting: Family Medicine

## 2024-09-05 VITALS — BP 135/83 | HR 82 | Ht 65.0 in | Wt 149.0 lb

## 2024-09-05 DIAGNOSIS — H6123 Impacted cerumen, bilateral: Secondary | ICD-10-CM

## 2024-09-05 DIAGNOSIS — R42 Dizziness and giddiness: Secondary | ICD-10-CM

## 2024-09-05 MED ORDER — MECLIZINE HCL 25 MG PO TABS: 25.0000 mg | ORAL_TABLET | Freq: Three times a day (TID) | ORAL | 0 refills | Status: AC | PRN

## 2024-09-05 NOTE — Progress Notes (Signed)
 Acute Office Visit  Subjective:     Patient ID: Robyn [name], female    DOB: June 22, 1996, 28 y.o.   MRN: [id number]  Chief Complaint  Patient presents with   Dizziness     Patient is in today for dizziness.   Discussed the use of AI scribe software for clinical note transcription with the patient, who gave verbal consent to proceed.  History of Present Illness Robyn [name] is a 28 year old female who presents with intermittent dizziness and a history of a severe vertigo episode.  She has been experiencing intermittent dizziness for the past one to two months. The episodes are random and vary in severity, with some days being worse than others. The dizziness is described as feeling 'a little off' and is sometimes accompanied by a headache and motion sickness. No new or unusual headaches, palpitations, chest pain, or fainting are reported.  Approximately one month ago, she experienced a severe episode of vertigo, waking up in the middle of the night with an intense spinning sensation, nausea, and vomiting that lasted all day. This was the only episode of such severity.  Her dizziness can be triggered by quick movements or changes in position, but it does not occur every time she moves. She also reports occasional 'weird vision' where things appear to be moving, but she has not had an eye exam in about a year.  She has a history of headaches and was previously evaluated for a different type of headache earlier this year, which was thought to be tension-related. She had a neck x-ray at that time and was advised to do stretches for what was thought to be tension-related headaches.  No earache, cold, or allergy symptoms, although her eyes water frequently without itchiness. She drinks water and Gatorade regularly, especially when feeling dizzy, but notes no significant improvement with increased fluid intake. Her dizziness episodes typically last about 30 minutes and can occur throughout the  day, although she has days without any symptoms.           All review of systems negative except what is listed in the HPI      Objective:    BP 135/83   Pulse 82   Ht 5' 5 (1.651 m)   Wt 149 lb (67.6 kg)   SpO2 100%   BMI 24.79 kg/m    Physical Exam Vitals reviewed.  Constitutional:      General: She is not in acute distress.    Appearance: Normal appearance. She is not ill-appearing.  HENT:     Head:     Comments: Negative Dix Hallpike    Right Ear: There is impacted cerumen.     Left Ear: There is impacted cerumen.  Cardiovascular:     Rate and Rhythm: Normal rate and regular rhythm.     Heart sounds: Normal heart sounds.  Pulmonary:     Effort: Pulmonary effort is normal.     Breath sounds: Normal breath sounds.  Skin:    General: Skin is warm and dry.  Neurological:     Mental Status: She is alert and oriented to person, place, and time.  Psychiatric:        Mood and Affect: Mood normal.        Behavior: Behavior normal.        Thought Content: Thought content normal.        Judgment: Judgment normal.         No results found for any visits  on 09/05/24.      Assessment & Plan:   Problem List Items Addressed This Visit   None Visit Diagnoses       Dizziness    -  Primary Intermittent mild dizziness, one episode of more intense vertigo a few weeks ago. No new headaches or neurological symptoms. Some mild vision changes, due for updated eye exam. Eating/drinking normally. No known triggers other than moving/position changes. Vitals stable today, negative orthostatics. Negative Trenda Craze. Significant cerumen impacted bilaterally. - Successful ear irrigation today. Possible effusion to left ear, no signs of infection.  - Start Flonase or Nasacort daily. - Sending PRN meclizine for more intense vertigo-like episodes - discussed medication.  - Schedule eye exam. - Monitor for new/worsening symptoms. Follow-up if not improving. ED for severe  symptoms.    Relevant Medications   meclizine (ANTIVERT) 25 MG tablet     Bilateral impacted cerumen          Indication: Cerumen impaction of the ear(s)  Medical necessity statement: On physical examination, cerumen impairs clinically significant portions of the external auditory canal, and tympanic membrane. Noted obstructive, copious cerumen that cannot be removed without magnification and instrumentations requiring professional removal.   Consent: Discussed benefits and risks of procedure and verbal consent obtained  Procedure: Patient was prepped for the procedure. Otoscope utilized to assess and take note of the ear canal, the tympanic membrane, and the presence, amount, and placement of the cerumen.  Gentle irrigation with water at body temperature utilized to remove impacted cerumen.  Excess water drained by gravity and ear canal(s) dried with clean guaze.  Post procedure examination: Otoscopic examination reveals complete cerumen removal with no damage to the auditory canal, tympanic membrane, or surrounding tissue.  Patient tolerated procedure well.   Post procedure instructions: Patient made aware that they may experience temporary vertigo, temporary changes in hearing, and temporary discomfort. If these symptom last for more than 24 hours to call the clinic or proceed to the ED for further evaluation. Discussed avoiding placing objects into the ear canal for cleaning.      Meds ordered this encounter  Medications   meclizine (ANTIVERT) 25 MG tablet    Sig: Take 1 tablet (25 mg total) by mouth 3 (three) times daily as needed for dizziness.    Dispense:  30 tablet    Refill:  0    Supervising Provider:   DOMENICA BLACKBIRD A [[id number]]    Return if symptoms worsen or fail to improve.  Waddell KATHEE Mon, NP

## 2024-10-30 DIAGNOSIS — Z01419 Encounter for gynecological examination (general) (routine) without abnormal findings: Secondary | ICD-10-CM | POA: Diagnosis not present
